# Patient Record
Sex: Female | Born: 1970
Health system: Southern US, Community
[De-identification: ages and names within clinical notes are randomized; demographics above are authoritative.]

## PROBLEM LIST (undated history)

## (undated) DIAGNOSIS — B009 Herpesviral infection, unspecified: Secondary | ICD-10-CM

## (undated) DIAGNOSIS — J189 Pneumonia, unspecified organism: Secondary | ICD-10-CM

## (undated) DIAGNOSIS — F419 Anxiety disorder, unspecified: Secondary | ICD-10-CM

## (undated) DIAGNOSIS — B20 Human immunodeficiency virus [HIV] disease: Secondary | ICD-10-CM

## (undated) DIAGNOSIS — F329 Major depressive disorder, single episode, unspecified: Secondary | ICD-10-CM

## (undated) DIAGNOSIS — F32A Depression, unspecified: Secondary | ICD-10-CM

## (undated) HISTORY — PX: DENTAL SURGERY: SHX609

---
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
## 2006-05-15 DIAGNOSIS — Z21 Asymptomatic human immunodeficiency virus [HIV] infection status: Secondary | ICD-10-CM

## 2006-05-15 DIAGNOSIS — B20 Human immunodeficiency virus [HIV] disease: Secondary | ICD-10-CM

## 2006-05-15 DIAGNOSIS — N823 Fistula of vagina to large intestine: Secondary | ICD-10-CM | POA: Insufficient documentation

## 2006-05-15 HISTORY — DX: Asymptomatic human immunodeficiency virus (hiv) infection status: Z21

## 2006-05-15 HISTORY — DX: Human immunodeficiency virus (HIV) disease: B20

## 2016-06-08 DIAGNOSIS — F3110 Bipolar disorder, current episode manic without psychotic features, unspecified: Secondary | ICD-10-CM | POA: Diagnosis not present

## 2017-07-12 ENCOUNTER — Emergency Department (HOSPITAL_COMMUNITY): Payer: Medicare Other

## 2017-07-12 ENCOUNTER — Encounter (HOSPITAL_COMMUNITY): Payer: Self-pay | Admitting: Emergency Medicine

## 2017-07-12 DIAGNOSIS — R197 Diarrhea, unspecified: Secondary | ICD-10-CM | POA: Diagnosis not present

## 2017-07-12 DIAGNOSIS — B372 Candidiasis of skin and nail: Secondary | ICD-10-CM | POA: Diagnosis present

## 2017-07-12 DIAGNOSIS — D509 Iron deficiency anemia, unspecified: Secondary | ICD-10-CM | POA: Diagnosis present

## 2017-07-12 DIAGNOSIS — R0902 Hypoxemia: Secondary | ICD-10-CM | POA: Diagnosis not present

## 2017-07-12 DIAGNOSIS — B59 Pneumocystosis: Secondary | ICD-10-CM | POA: Diagnosis not present

## 2017-07-12 DIAGNOSIS — F3162 Bipolar disorder, current episode mixed, moderate: Secondary | ICD-10-CM | POA: Diagnosis present

## 2017-07-12 DIAGNOSIS — F419 Anxiety disorder, unspecified: Secondary | ICD-10-CM | POA: Diagnosis not present

## 2017-07-12 DIAGNOSIS — Z91013 Allergy to seafood: Secondary | ICD-10-CM

## 2017-07-12 DIAGNOSIS — B20 Human immunodeficiency virus [HIV] disease: Secondary | ICD-10-CM | POA: Diagnosis not present

## 2017-07-12 DIAGNOSIS — Z87891 Personal history of nicotine dependence: Secondary | ICD-10-CM

## 2017-07-12 DIAGNOSIS — J9601 Acute respiratory failure with hypoxia: Secondary | ICD-10-CM | POA: Diagnosis present

## 2017-07-12 DIAGNOSIS — E860 Dehydration: Secondary | ICD-10-CM | POA: Diagnosis present

## 2017-07-12 DIAGNOSIS — R05 Cough: Secondary | ICD-10-CM | POA: Diagnosis not present

## 2017-07-12 DIAGNOSIS — J189 Pneumonia, unspecified organism: Secondary | ICD-10-CM | POA: Diagnosis not present

## 2017-07-12 DIAGNOSIS — Z23 Encounter for immunization: Secondary | ICD-10-CM

## 2017-07-12 DIAGNOSIS — J439 Emphysema, unspecified: Secondary | ICD-10-CM | POA: Diagnosis not present

## 2017-07-12 LAB — COMPREHENSIVE METABOLIC PANEL
ALT: 13 U/L — ABNORMAL LOW (ref 14–54)
ANION GAP: 11 (ref 5–15)
AST: 34 U/L (ref 15–41)
Albumin: 3 g/dL — ABNORMAL LOW (ref 3.5–5.0)
Alkaline Phosphatase: 66 U/L (ref 38–126)
BUN: 10 mg/dL (ref 6–20)
CHLORIDE: 101 mmol/L (ref 101–111)
CO2: 23 mmol/L (ref 22–32)
Calcium: 8.4 mg/dL — ABNORMAL LOW (ref 8.9–10.3)
Creatinine, Ser: 0.8 mg/dL (ref 0.44–1.00)
GFR calc Af Amer: >60 mL/min (ref >60)
Glucose, Bld: 93 mg/dL (ref 65–99)
POTASSIUM: 3.2 mmol/L — AB (ref 3.5–5.1)
Sodium: 135 mmol/L (ref 135–145)
TOTAL PROTEIN: 7.4 g/dL (ref 6.5–8.1)
Total Bilirubin: 0.7 mg/dL (ref 0.3–1.2)

## 2017-07-12 LAB — CBC
HEMATOCRIT: 31.6 % — AB (ref 36.0–46.0)
HEMOGLOBIN: 9.6 g/dL — AB (ref 12.0–15.0)
MCH: 22.1 pg — ABNORMAL LOW (ref 26.0–34.0)
MCHC: 30.4 g/dL (ref 30.0–36.0)
MCV: 72.6 fL — AB (ref 78.0–100.0)
Platelets: 245 10*3/uL (ref 150–400)
RBC: 4.35 MIL/uL (ref 3.87–5.11)
RDW: 18.8 % — AB (ref 11.5–15.5)
WBC: 6.2 10*3/uL (ref 4.0–10.5)

## 2017-07-12 LAB — LIPASE, BLOOD: LIPASE: 26 U/L (ref 11–51)

## 2017-07-12 MED ORDER — ACETAMINOPHEN 325 MG PO TABS
650.0000 mg | ORAL_TABLET | Freq: Once | ORAL | Status: AC | PRN
  Administered 2017-07-12: 650 mg via ORAL
  Filled 2017-07-12: qty 2

## 2017-07-12 NOTE — ED Triage Notes (Addendum)
Patient c/o productive cough x2 months and diarrhea x2 weeks. Denies abdominal pain and N/V/D. Hx HIV.

## 2017-07-13 ENCOUNTER — Other Ambulatory Visit: Payer: Self-pay

## 2017-07-13 ENCOUNTER — Emergency Department (HOSPITAL_COMMUNITY): Payer: Medicare Other

## 2017-07-13 ENCOUNTER — Inpatient Hospital Stay (HOSPITAL_COMMUNITY)
Admission: EM | Admit: 2017-07-13 | Discharge: 2017-07-19 | DRG: 974 | Disposition: A | Discharge status: Home / Self Care | Payer: Medicare Other | Attending: Internal Medicine | Admitting: Internal Medicine

## 2017-07-13 ENCOUNTER — Encounter (HOSPITAL_COMMUNITY): Payer: Self-pay

## 2017-07-13 DIAGNOSIS — J9601 Acute respiratory failure with hypoxia: Secondary | ICD-10-CM | POA: Diagnosis present

## 2017-07-13 DIAGNOSIS — F32A Depression, unspecified: Secondary | ICD-10-CM | POA: Diagnosis present

## 2017-07-13 DIAGNOSIS — F3112 Bipolar disorder, current episode manic without psychotic features, moderate: Secondary | ICD-10-CM

## 2017-07-13 DIAGNOSIS — B20 Human immunodeficiency virus [HIV] disease: Secondary | ICD-10-CM | POA: Diagnosis present

## 2017-07-13 DIAGNOSIS — Z23 Encounter for immunization: Secondary | ICD-10-CM | POA: Diagnosis not present

## 2017-07-13 DIAGNOSIS — R062 Wheezing: Secondary | ICD-10-CM | POA: Diagnosis not present

## 2017-07-13 DIAGNOSIS — E860 Dehydration: Secondary | ICD-10-CM | POA: Diagnosis not present

## 2017-07-13 DIAGNOSIS — Z87891 Personal history of nicotine dependence: Secondary | ICD-10-CM | POA: Diagnosis not present

## 2017-07-13 DIAGNOSIS — Z21 Asymptomatic human immunodeficiency virus [HIV] infection status: Secondary | ICD-10-CM | POA: Diagnosis present

## 2017-07-13 DIAGNOSIS — B59 Pneumocystosis: Secondary | ICD-10-CM | POA: Diagnosis not present

## 2017-07-13 DIAGNOSIS — J439 Emphysema, unspecified: Secondary | ICD-10-CM | POA: Diagnosis not present

## 2017-07-13 DIAGNOSIS — F329 Major depressive disorder, single episode, unspecified: Secondary | ICD-10-CM | POA: Diagnosis present

## 2017-07-13 DIAGNOSIS — R51 Headache: Secondary | ICD-10-CM | POA: Diagnosis not present

## 2017-07-13 DIAGNOSIS — F3162 Bipolar disorder, current episode mixed, moderate: Secondary | ICD-10-CM | POA: Diagnosis present

## 2017-07-13 DIAGNOSIS — R197 Diarrhea, unspecified: Secondary | ICD-10-CM | POA: Diagnosis present

## 2017-07-13 DIAGNOSIS — R0902 Hypoxemia: Secondary | ICD-10-CM

## 2017-07-13 DIAGNOSIS — Z91013 Allergy to seafood: Secondary | ICD-10-CM | POA: Diagnosis not present

## 2017-07-13 DIAGNOSIS — J189 Pneumonia, unspecified organism: Secondary | ICD-10-CM | POA: Diagnosis present

## 2017-07-13 DIAGNOSIS — D509 Iron deficiency anemia, unspecified: Secondary | ICD-10-CM | POA: Diagnosis present

## 2017-07-13 DIAGNOSIS — F419 Anxiety disorder, unspecified: Secondary | ICD-10-CM | POA: Diagnosis present

## 2017-07-13 DIAGNOSIS — R05 Cough: Secondary | ICD-10-CM | POA: Diagnosis not present

## 2017-07-13 DIAGNOSIS — K5900 Constipation, unspecified: Secondary | ICD-10-CM | POA: Diagnosis not present

## 2017-07-13 DIAGNOSIS — R42 Dizziness and giddiness: Secondary | ICD-10-CM | POA: Diagnosis not present

## 2017-07-13 DIAGNOSIS — B379 Candidiasis, unspecified: Secondary | ICD-10-CM | POA: Diagnosis not present

## 2017-07-13 DIAGNOSIS — R509 Fever, unspecified: Secondary | ICD-10-CM | POA: Diagnosis not present

## 2017-07-13 DIAGNOSIS — R634 Abnormal weight loss: Secondary | ICD-10-CM | POA: Diagnosis not present

## 2017-07-13 DIAGNOSIS — B372 Candidiasis of skin and nail: Secondary | ICD-10-CM | POA: Diagnosis present

## 2017-07-13 DIAGNOSIS — R0602 Shortness of breath: Secondary | ICD-10-CM | POA: Diagnosis not present

## 2017-07-13 DIAGNOSIS — R0600 Dyspnea, unspecified: Secondary | ICD-10-CM | POA: Diagnosis not present

## 2017-07-13 HISTORY — DX: Human immunodeficiency virus (HIV) disease: B20

## 2017-07-13 HISTORY — DX: Depression, unspecified: F32.A

## 2017-07-13 HISTORY — DX: Major depressive disorder, single episode, unspecified: F32.9

## 2017-07-13 HISTORY — DX: Anxiety disorder, unspecified: F41.9

## 2017-07-13 LAB — INFLUENZA PANEL BY PCR (TYPE A & B)
Influenza A By PCR: NEGATIVE
Influenza B By PCR: NEGATIVE

## 2017-07-13 LAB — URINALYSIS, ROUTINE W REFLEX MICROSCOPIC
Bilirubin Urine: NEGATIVE
GLUCOSE, UA: NEGATIVE mg/dL
KETONES UR: 20 mg/dL — AB
NITRITE: NEGATIVE
PROTEIN: NEGATIVE mg/dL
Specific Gravity, Urine: 1.035 — ABNORMAL HIGH (ref 1.005–1.030)
pH: 6 (ref 5.0–8.0)

## 2017-07-13 LAB — T-HELPER CELLS (CD4) COUNT (NOT AT ARMC)
CD4 % Helper T Cell: 8 % — ABNORMAL LOW (ref 33–55)
CD4 T Cell Abs: 50 /uL — ABNORMAL LOW (ref 400–2700)

## 2017-07-13 LAB — POC URINE PREG, ED: Preg Test, Ur: NEGATIVE

## 2017-07-13 MED ORDER — SODIUM CHLORIDE 0.9% FLUSH
3.0000 mL | Freq: Two times a day (BID) | INTRAVENOUS | Status: DC
  Administered 2017-07-13 – 2017-07-18 (×8): 3 mL via INTRAVENOUS

## 2017-07-13 MED ORDER — SODIUM CHLORIDE 0.9 % IV SOLN
1.0000 g | Freq: Once | INTRAVENOUS | Status: AC
  Administered 2017-07-13: 1 g via INTRAVENOUS
  Filled 2017-07-13: qty 10

## 2017-07-13 MED ORDER — SODIUM CHLORIDE 0.9 % IV SOLN
250.0000 mL | INTRAVENOUS | Status: DC | PRN
  Administered 2017-07-14: 250 mL via INTRAVENOUS

## 2017-07-13 MED ORDER — SODIUM CHLORIDE 0.9 % IJ SOLN
INTRAMUSCULAR | Status: AC
  Filled 2017-07-13: qty 50

## 2017-07-13 MED ORDER — SODIUM CHLORIDE 0.9 % IV SOLN
500.0000 mg | INTRAVENOUS | Status: DC
  Administered 2017-07-14: 500 mg via INTRAVENOUS
  Filled 2017-07-13 (×2): qty 500

## 2017-07-13 MED ORDER — IOPAMIDOL (ISOVUE-300) INJECTION 61%
75.0000 mL | Freq: Once | INTRAVENOUS | Status: AC | PRN
  Administered 2017-07-13: 75 mL via INTRAVENOUS

## 2017-07-13 MED ORDER — SODIUM CHLORIDE 0.9% FLUSH: 3.0000 mL | INTRAVENOUS | Status: DC | PRN

## 2017-07-13 MED ORDER — SODIUM CHLORIDE 0.9 % IV BOLUS (SEPSIS)
500.0000 mL | Freq: Once | INTRAVENOUS | Status: AC
  Administered 2017-07-13: 500 mL via INTRAVENOUS

## 2017-07-13 MED ORDER — SODIUM CHLORIDE 0.9 % IV SOLN
500.0000 mg | Freq: Once | INTRAVENOUS | Status: AC
  Administered 2017-07-13: 500 mg via INTRAVENOUS
  Filled 2017-07-13: qty 500

## 2017-07-13 MED ORDER — SODIUM CHLORIDE 0.9 % IV SOLN
1.0000 g | INTRAVENOUS | Status: DC
  Administered 2017-07-14: 1 g via INTRAVENOUS
  Filled 2017-07-13: qty 1

## 2017-07-13 MED ORDER — SODIUM CHLORIDE 0.9 % IV BOLUS (SEPSIS)
1000.0000 mL | Freq: Once | INTRAVENOUS | Status: AC
  Administered 2017-07-13: 1000 mL via INTRAVENOUS

## 2017-07-13 MED ORDER — IOPAMIDOL (ISOVUE-300) INJECTION 61%
INTRAVENOUS | Status: AC
  Filled 2017-07-13: qty 75

## 2017-07-13 NOTE — ED Notes (Addendum)
Droplet precautions initiated and maintained 

## 2017-07-13 NOTE — ED Provider Notes (Signed)
[facility]-EMERGENCY DEPT Provider Note   CSN: [id number] Arrival date & time: 07/12/17  1959  Time seen 05:00 AM   History   Chief Complaint Chief Complaint  Patient presents with  . Cough  . Diarrhea    HPI Grace Randall is a 47 y.o. female.  HPI patient states she has had a cough for 3 or 4 weeks.  She feels short of breath.  She states her throat is dry.  She states however at times her cough is mild.  She started sneezing 3 or 4 weeks ago.  She has had diarrhea for a few days.  She states she gets diarrhea "every time I eat or drink".  She states she had 3 episodes the night before and 2 during the day today.  She states that slimy and yellow.  She denies nausea, vomiting, abdominal pain.  She states she feels dizzy and lightheaded and weak.  She states all she does is sleep.  She is unsure of when she started having fevers but she has had chills for a week.  She denies wheezing.  She reports being around other people who are sick.  PCP in [facility]  Past Medical History:  Diagnosis Date  . Anxiety   . Depression   . HIV (human immunodeficiency virus infection) (HCC)     There are no active problems to display for this patient.   History reviewed. No pertinent surgical history.  OB History    No data available       Home Medications    Not taking Seroquel and Risperdal  Prior to Admission medications   Medication Sig Start Date End Date Taking? Authorizing Provider  dextromethorphan-guaiFENesin (MUCINEX DM) 30-600 MG 12hr tablet Take 1 tablet by mouth at bedtime as needed for cough.   Yes [provider]  Pseudoeph-Doxylamine-DM-APAP (NYQUIL PO) Take 1 capsule by mouth at bedtime as needed (cold).   Yes [provider]  Pseudoephedrine-APAP-DM (DAYQUIL PO) Take 1 capsule by mouth daily as needed (cold).   Yes [provider]    Family History No family history on file.  Social History Social History   Tobacco Use    . Smoking status: Not on file  Substance Use Topics  . Alcohol use: Not on file  . Drug use: Not on file  on disability for depression and anxiety Smokes 1 ppd Drinks occasionally, last 6 months ago   Allergies   Other   Review of Systems Review of Systems  All other systems reviewed and are negative.    Physical Exam Updated Vital Signs BP 116/64 (BP Location: Left Arm)   Pulse 85   Temp 98.8 F (37.1 C) (Oral)   Resp 16   LMP 07/12/2017   SpO2 99%   Physical Exam  Constitutional: She is oriented to person, place, and time.  Non-toxic appearance. She does not appear ill. No distress.  Thin female  HENT:  Head: Normocephalic and atraumatic.  Right Ear: External ear normal.  Left Ear: External ear normal.  Nose: Nose normal. No mucosal edema or rhinorrhea.  Mouth/Throat: Oropharynx is clear and moist and mucous membranes are normal. No dental abscesses or uvula swelling.  Eyes: Conjunctivae and EOM are normal. Pupils are equal, round, and reactive to light.  Neck: Normal range of motion and full passive range of motion without pain. Neck supple.  Cardiovascular: Regular rhythm and normal heart sounds. Tachycardia present. Exam reveals no gallop and no friction rub.  No  murmur heard. Pulmonary/Chest: Effort normal. Tachypnea noted. No respiratory distress. She has decreased breath sounds. She has no wheezes. She has no rhonchi. She has no rales. She exhibits no tenderness and no crepitus.  Abdominal: Soft. Normal appearance and bowel sounds are normal. She exhibits no distension. There is no tenderness. There is no rebound and no guarding.  Musculoskeletal: Normal range of motion. She exhibits no edema or tenderness.  Moves all extremities well.   Neurological: She is alert and oriented to person, place, and time. She has normal strength. No cranial nerve deficit.  Skin: Skin is warm, dry and intact. No rash noted. No erythema. No pallor.  Psychiatric: She has a normal  mood and affect. Her speech is normal and behavior is normal. Her mood appears not anxious.  Nursing note and vitals reviewed.    ED Treatments / Results  Labs (all labs ordered are listed, but only abnormal results are displayed) Results for orders placed or performed during the hospital encounter of 07/13/17  Lipase, blood  Result Value Ref Range   Lipase 26 11 - 51 U/L  Comprehensive metabolic panel  Result Value Ref Range   Sodium 135 135 - 145 mmol/L   Potassium 3.2 (L) 3.5 - 5.1 mmol/L   Chloride 101 101 - 111 mmol/L   CO2 23 22 - 32 mmol/L   Glucose, Bld 93 65 - 99 mg/dL   BUN 10 6 - 20 mg/dL   Creatinine, Ser 1.61 0.44 - 1.00 mg/dL   Calcium 8.4 (L) 8.9 - 10.3 mg/dL   Total Protein 7.4 6.5 - 8.1 g/dL   Albumin 3.0 (L) 3.5 - 5.0 g/dL   AST 34 15 - 41 U/L   ALT 13 (L) 14 - 54 U/L   Alkaline Phosphatase 66 38 - 126 U/L   Total Bilirubin 0.7 0.3 - 1.2 mg/dL   GFR calc non Af Amer >60 >60 mL/min   GFR calc Af Amer >60 >60 mL/min   Anion gap 11 5 - 15  CBC  Result Value Ref Range   WBC 6.2 4.0 - 10.5 K/uL   RBC 4.35 3.87 - 5.11 MIL/uL   Hemoglobin 9.6 (L) 12.0 - 15.0 g/dL   HCT 09.6 (L) 04.5 - 40.9 %   MCV 72.6 (L) 78.0 - 100.0 fL   MCH 22.1 (L) 26.0 - 34.0 pg   MCHC 30.4 30.0 - 36.0 g/dL   RDW 81.1 (H) 91.4 - 78.2 %   Platelets 245 150 - 400 K/uL   Laboratory interpretation all normal except anemia with no old labs to compare    EKG  EKG Interpretation None       Radiology Dg Chest 2 View  Result Date: 07/12/2017 CLINICAL DATA:  Cough EXAM: CHEST  2 VIEW COMPARISON:  None. FINDINGS: Suggestion of mild perihilar ground-glass opacity. No consolidation or pleural effusion. Normal heart size. No pneumothorax. IMPRESSION: Possible subtle perihilar ground-glass infiltrates, CT would be more confirmatory. Otherwise negative two-view chest. Electronically Signed   By: Jasmine Pang M.D.   On: 07/12/2017 20:55   Ct Chest W Contrast  Result Date:  07/13/2017 CLINICAL DATA:  47 year old female with cough.  History of HIV. EXAM: CT CHEST WITH CONTRAST TECHNIQUE: Multidetector CT imaging of the chest was performed during intravenous contrast administration. CONTRAST:  75mL ISOVUE-300 IOPAMIDOL (ISOVUE-300) INJECTION 61% COMPARISON:  Chest radiograph dated 07/12/2017 FINDINGS: Cardiovascular: There is no cardiomegaly or pericardial effusion. The thoracic aorta is unremarkable. The origins of the great vessels of  the aortic arch are patent. Evaluation of the pulmonary arteries is limited due to suboptimal enhancement and timing of the contrast. This study is essentially nondiagnostic for evaluation of pulmonary embolism. Mediastinum/Nodes: No hilar adenopathy. Anterior paratracheal lymph node measures 13 mm in short axis. The esophagus is grossly unremarkable. Heterogeneous appearance of the left thyroid gland and probable subcentimeter left thyroid nodule. Ultrasound may provide better evaluation. No mediastinal fluid collection. Lungs/Pleura: There is subpleural emphysema with small cystic changes of the upper lobes. Diffuse bilateral perihilar and upper lobe predominant ground-glass density throughout the lungs concerning for pneumonia, possibly multifocal bacterial infection or related to an opportunistic agent such as pneumocystis pneumonia (PCP) or CMV pneumonia. Non infectious etiology such as lymphocytic interstitial pneumonitis is less likely given rebound upper lobe involvement. Clinical correlation and pulmonary consult is advised. There is no pleural effusion or pneumothorax. The central airways are patent. Upper Abdomen: No acute abnormality. Musculoskeletal: No chest wall abnormality. No acute or significant osseous findings. IMPRESSION: Diffuse bilateral and upper lobe predominant ground-glass and hazy airspace density concerning for pneumonia. Clinical correlation and pulmonary consult is advised. Electronically Signed   By: Elgie CollardArash  Radparvar M.D.    On: 07/13/2017 07:01    Procedures Procedures (including critical care time)  Medications Ordered in ED Medications  azithromycin (ZITHROMAX) 500 mg in sodium chloride 0.9 % 250 mL IVPB (500 mg Intravenous New Bag/Given 07/13/17 0656)  sodium chloride 0.9 % injection (not administered)  acetaminophen (TYLENOL) tablet 650 mg (650 mg Oral Given 07/12/17 2031)  sodium chloride 0.9 % bolus 1,000 mL (0 mLs Intravenous Stopped 07/13/17 0700)  sodium chloride 0.9 % bolus 500 mL (500 mLs Intravenous New Bag/Given 07/13/17 0547)  cefTRIAXone (ROCEPHIN) 1 g in sodium chloride 0.9 % 100 mL IVPB (0 g Intravenous Stopped 07/13/17 0658)  iopamidol (ISOVUE-300) 61 % injection 75 mL (75 mLs Intravenous Contrast Given 07/13/17 [id number])     Initial Impression / Assessment and Plan / ED Course  I have reviewed the triage vital signs and the nursing notes.  Pertinent labs & imaging results that were available during my care of the patient were reviewed by me and considered in my medical decision making (see chart for details).     During my exam patient's pulse ox was 84-86%.  She was placed on nasal cannula oxygen.  She was given IV fluids for dehydration.  We discussed her lab tests and her CXR and she is agreeable to have a chest CT to evaluate her abnormal chest x-ray. She states she has HIV on no meds and her viral loads had been barely Detectable before and her CD4 counts were "okay". She was started on IV antibiotics.  She was started on community-acquired pneumonia antibiotics.  After reviewing patient's CT scan consult was made to the hospitalist for admission.  Patient is hypoxic with her pneumonia, she has a history of HIV although she has a normal total white blood cell count she does not appear to be immune compromised.  07:21 AM Dr Onalee Huaavid, will admit. Flu test ordered.   Final Clinical Impressions(s) / ED Diagnoses   Final diagnoses:  Community acquired pneumonia, unspecified laterality  Hypoxia   Diarrhea, unspecified type  Dehydration    Plan admission  Devoria AlbeIva Raymonde Hamblin, MD, [facility]P    Hilarie Sinha, MD 07/13/17 [phone]

## 2017-07-13 NOTE — H&P (Signed)
History and Physical    Phylicia Mcgaugh ZOX:[id number] DOB: [birth year] DOA: 07/13/2017  PCP: System, Pcp Not In  Patient coming from: Home  Chief Complaint: Cough  HPI: Grace Randall is a 47 y.o. female with medical history significant of HIV off medications for over 2 years, anxiety, depression comes in with over a month of coughing that is progressively worsened.  She reports a lot of diarrhea also.  She did not know she was running fever her temperature here is almost 102.  She denies any flulike symptoms.  She denies any abdominal pain.  She has not had any nausea or vomiting.  Patient reports she is been off of her meds because she did not like taking them.  She was previously getting her care in [place] she is currently living in [place].  Patient is very upset that I have discussing her HIV status with her.  She is refusing to see an ID doctor and does not want to establish care with anyone for her HIV.  She says "I just do not want to do with that right now".  Patient is referred for admission for bilateral pneumonia.  She is hypoxic at 85% on room air.  Review of Systems: As per HPI otherwise 10 point review of systems negative.   Past Medical History:  Diagnosis Date  . Anxiety   . Depression   . HIV (human immunodeficiency virus infection) (HCC)     History reviewed. No pertinent surgical history.  None   has no tobacco, alcohol, and drug history on file.  Negative x3  Allergies  Allergen Reactions  . Other Anaphylaxis and Other (See Comments)    No Seafood     No family history on file.  No premature coronary artery disease  Prior to Admission medications   Medication Sig Start Date End Date Taking? Authorizing Provider  dextromethorphan-guaiFENesin (MUCINEX DM) 30-600 MG 12hr tablet Take 1 tablet by mouth at bedtime as needed for cough.   Yes [provider]  Pseudoeph-Doxylamine-DM-APAP (NYQUIL PO) Take 1 capsule by mouth at bedtime as needed (cold).   Yes  [provider]  Pseudoephedrine-APAP-DM (DAYQUIL PO) Take 1 capsule by mouth daily as needed (cold).   Yes [provider]    Physical Exam: Vitals:   07/12/17 2022 07/13/17 0014 07/13/17 0429 07/13/17 0659  BP: 130/73 98/65 (!) 134/58 116/64  Pulse: (!) 106 89 99 85  Resp: 20 (!) 22 17 16   Temp: (!) 101.5 F (38.6 C) 98.5 F (36.9 C) 98.8 F (37.1 C)   TempSrc: Oral Oral Oral   SpO2: 90% 90% 90% 99%      Constitutional: NAD, calm, comfortable Vitals:   07/12/17 2022 07/13/17 0014 07/13/17 0429 07/13/17 0659  BP: 130/73 98/65 (!) 134/58 116/64  Pulse: (!) 106 89 99 85  Resp: 20 (!) 22 17 16   Temp: (!) 101.5 F (38.6 C) 98.5 F (36.9 C) 98.8 F (37.1 C)   TempSrc: Oral Oral Oral   SpO2: 90% 90% 90% 99%   Eyes: PERRL, lids and conjunctivae normal ENMT: Mucous membranes are moist. Posterior pharynx clear of any exudate or lesions.Normal dentition.  Neck: normal, supple, no masses, no thyromegaly Respiratory: clear to auscultation bilaterally, no wheezing, no crackles. Normal respiratory effort. No accessory muscle use.  Cardiovascular: Regular rate and rhythm, no murmurs / rubs / gallops. No extremity edema. 2+ pedal pulses. No carotid bruits.  Abdomen: no tenderness, no masses palpated. No hepatosplenomegaly. Bowel sounds positive.  Musculoskeletal:  no clubbing / cyanosis. No joint deformity upper and lower extremities. Good ROM, no contractures. Normal muscle tone.  Skin: no rashes, lesions, ulcers. No induration Neurologic: CN 2-12 grossly intact. Sensation intact, DTR normal. Strength 5/5 in all 4.  Psychiatric: Normal judgment and insight. Alert and oriented x 3. Normal mood.    Labs on Admission: I have personally reviewed following labs and imaging studies  CBC: Recent Labs  Lab 07/12/17 2045  WBC 6.2  HGB 9.6*  HCT 31.6*  MCV 72.6*  PLT 245   Basic Metabolic Panel: Recent Labs  Lab 07/12/17 2045  NA 135  K 3.2*  CL 101  CO2 23    GLUCOSE 93  BUN 10  CREATININE 0.80  CALCIUM 8.4*   GFR: CrCl cannot be calculated (Unknown ideal weight.). Liver Function Tests: Recent Labs  Lab 07/12/17 2045  AST 34  ALT 13*  ALKPHOS 66  BILITOT 0.7  PROT 7.4  ALBUMIN 3.0*   Recent Labs  Lab 07/12/17 2045  LIPASE 26   No results for input(s): AMMONIA in the last 168 hours. Coagulation Profile: No results for input(s): INR, PROTIME in the last 168 hours. Cardiac Enzymes: No results for input(s): CKTOTAL, CKMB, CKMBINDEX, TROPONINI in the last 168 hours. BNP (last 3 results) No results for input(s): PROBNP in the last 8760 hours. HbA1C: No results for input(s): HGBA1C in the last 72 hours. CBG: No results for input(s): GLUCAP in the last 168 hours. Lipid Profile: No results for input(s): CHOL, HDL, LDLCALC, TRIG, CHOLHDL, LDLDIRECT in the last 72 hours. Thyroid Function Tests: No results for input(s): TSH, T4TOTAL, FREET4, T3FREE, THYROIDAB in the last 72 hours. Anemia Panel: No results for input(s): VITAMINB12, FOLATE, FERRITIN, TIBC, IRON, RETICCTPCT in the last 72 hours. Urine analysis: No results found for: COLORURINE, APPEARANCEUR, LABSPEC, PHURINE, GLUCOSEU, HGBUR, BILIRUBINUR, KETONESUR, PROTEINUR, UROBILINOGEN, NITRITE, LEUKOCYTESUR Sepsis Labs: !!!!!!!!!!!!!!!!!!!!!!!!!!!!!!!!!!!!!!!!!!!! @LABRCNTIP (procalcitonin:4,lacticidven:4) )No results found for this or any previous visit (from the past 240 hour(s)).   Radiological Exams on Admission: Dg Chest 2 View  Result Date: 07/12/2017 CLINICAL DATA:  Cough EXAM: CHEST  2 VIEW COMPARISON:  None. FINDINGS: Suggestion of mild perihilar ground-glass opacity. No consolidation or pleural effusion. Normal heart size. No pneumothorax. IMPRESSION: Possible subtle perihilar ground-glass infiltrates, CT would be more confirmatory. Otherwise negative two-view chest. Electronically Signed   By: Jasmine PangKim  Fujinaga M.D.   On: 07/12/2017 20:55   Ct Chest W Contrast  Result  Date: 07/13/2017 CLINICAL DATA:  47 year old female with cough.  History of HIV. EXAM: CT CHEST WITH CONTRAST TECHNIQUE: Multidetector CT imaging of the chest was performed during intravenous contrast administration. CONTRAST:  75mL ISOVUE-300 IOPAMIDOL (ISOVUE-300) INJECTION 61% COMPARISON:  Chest radiograph dated 07/12/2017 FINDINGS: Cardiovascular: There is no cardiomegaly or pericardial effusion. The thoracic aorta is unremarkable. The origins of the great vessels of the aortic arch are patent. Evaluation of the pulmonary arteries is limited due to suboptimal enhancement and timing of the contrast. This study is essentially nondiagnostic for evaluation of pulmonary embolism. Mediastinum/Nodes: No hilar adenopathy. Anterior paratracheal lymph node measures 13 mm in short axis. The esophagus is grossly unremarkable. Heterogeneous appearance of the left thyroid gland and probable subcentimeter left thyroid nodule. Ultrasound may provide better evaluation. No mediastinal fluid collection. Lungs/Pleura: There is subpleural emphysema with small cystic changes of the upper lobes. Diffuse bilateral perihilar and upper lobe predominant ground-glass density throughout the lungs concerning for pneumonia, possibly multifocal bacterial infection or related to an opportunistic agent such as pneumocystis pneumonia (  PCP) or CMV pneumonia. Non infectious etiology such as lymphocytic interstitial pneumonitis is less likely given rebound upper lobe involvement. Clinical correlation and pulmonary consult is advised. There is no pleural effusion or pneumothorax. The central airways are patent. Upper Abdomen: No acute abnormality. Musculoskeletal: No chest wall abnormality. No acute or significant osseous findings. IMPRESSION: Diffuse bilateral and upper lobe predominant ground-glass and hazy airspace density concerning for pneumonia. Clinical correlation and pulmonary consult is advised. Electronically Signed   By: Elgie Collard  M.D.   On: 07/13/2017 07:01    Chest x-ray reviewed bilateral groundglass infiltrates Case discussed with Dr. Lynelle Doctor in the ED Old chart reviewed Case discussed with Dr. Ninetta Lights with ID  Assessment/Plan 47 year old female HIV status comes in with fever and cough found to have bilateral pneumonia and acute hypoxic respiratory failure Principal Problem:   PNA (pneumonia)-placed on community-acquired coverage with Rocephin and azithromycin.  Obtain sputum culture.  Check influenza panel.  Blood cultures obtained.  Supplemental oxygen as needed.  Patient currently in no respiratory distress.  Active Problems:   Acute respiratory failure with hypoxia (HCC)-due to bilateral pneumonia as above    HIV (human immunodeficiency virus infection) (HCC)-check viral load and CD4 count    Diarrhea-check stool cultures, abdominal exam benign    Anxiety-noted    Depression-noted   I discussed with Dr. Ninetta Lights recommends community acquired pneumonia coverage and obtaining culture data.  Have not requested a formal consult at this time because patient is refusing to see ID at this time.  I have encouraged patient to think about reconsidering this decision and she is going to think about this and may change her mind in the next day or so.  She thinks her HIV is still undetectable  Despite being off meds for over 2 years.  Discussion was held with emergency room nurse as witness.  Patient is very worried that her family and friends are going to find out about her HIV status.  I have reassured her that we will provide and protect her privacy while she is here.   DVT prophylaxis: SCDs Code Status: Full Family Communication: None isposition Plan: Per day team Consults called: None Admission status: Admission   [name] A MD [facility]  If 7PM-7AM, please contact night-coverage [email or web address] Password [email or web address]  07/13/2017, 8:09 AM

## 2017-07-13 NOTE — ED Notes (Signed)
ED TO INPATIENT HANDOFF REPORT  Name/Age/Gender Grace Randall 47 y.o. female  Code Status    Code Status Orders  (From admission, onward)        Start     Ordered   07/13/17 0812  Full code  Continuous     07/13/17 0812    Code Status History    Date Active Date Inactive Code Status Order ID Comments User Context   This patient has a current code status but no historical code status.      Home/SNF/Other Home  Chief Complaint Cough;Dirrhea  Level of Care/Admitting Diagnosis ED Disposition    ED Disposition Condition Comment   Admit  Hospital Area: [facility] [[id number]]  Level of Care: Med-Surg [16]  Diagnosis: PNA (pneumonia) [696295]  Admitting Physician: Phillips Grout [[id number]]  Attending Physician: Derrill Kay A [[id number]]  Estimated length of stay: 3 - 4 days  Certification:: I certify this patient will need inpatient services for at least 2 midnights  PT Class (Do Not Modify): Inpatient [101]  PT Acc Code (Do Not Modify): Private [1]       Medical History Past Medical History:  Diagnosis Date  . Anxiety   . Depression   . HIV (human immunodeficiency virus infection) ([place])     Allergies Allergies  Allergen Reactions  . Other Anaphylaxis and Other (See Comments)    No Seafood     IV Location/Drains/Wounds Patient Lines/Drains/Airways Status   Active Line/Drains/Airways    None          Labs/Imaging Results for orders placed or performed during the hospital encounter of 07/13/17 (from the past 48 hour(s))  Lipase, blood     Status: None   Collection Time: 07/12/17  8:45 PM  Result Value Ref Range   Lipase 26 11 - 51 U/L    Comment: Performed at [facility], [place] [address] [place], [place] [address]  Comprehensive metabolic panel     Status: Abnormal   Collection Time: 07/12/17  8:45 PM  Result Value Ref Range   Sodium 135 135 - 145 mmol/L   Potassium 3.2 (L) 3.5 - 5.1 mmol/L   Chloride 101 101 - 111  mmol/L   CO2 23 22 - 32 mmol/L   Glucose, Bld 93 65 - 99 mg/dL   BUN 10 6 - 20 mg/dL   Creatinine, Ser 0.80 0.44 - 1.00 mg/dL   Calcium 8.4 (L) 8.9 - 10.3 mg/dL   Total Protein 7.4 6.5 - 8.1 g/dL   Albumin 3.0 (L) 3.5 - 5.0 g/dL   AST 34 15 - 41 U/L   ALT 13 (L) 14 - 54 U/L   Alkaline Phosphatase 66 38 - 126 U/L   Total Bilirubin 0.7 0.3 - 1.2 mg/dL   GFR calc non Af Amer >60 >60 mL/min   GFR calc Af Amer >60 >60 mL/min    Comment: (NOTE) The eGFR has been calculated using the CKD EPI equation. This calculation has not been validated in all clinical situations. eGFR's persistently <60 mL/min signify possible Chronic Kidney Disease.    Anion gap 11 5 - 15    Comment: Performed at [facility], [place] [address] [place], [place] [address]  CBC     Status: Abnormal   Collection Time: 07/12/17  8:45 PM  Result Value Ref Range   WBC 6.2 4.0 - 10.5 K/uL   RBC 4.35 3.87 - 5.11 MIL/uL   Hemoglobin 9.6 (L) 12.0 -  15.0 g/dL   HCT 31.6 (L) 36.0 - 46.0 %   MCV 72.6 (L) 78.0 - 100.0 fL   MCH 22.1 (L) 26.0 - 34.0 pg   MCHC 30.4 30.0 - 36.0 g/dL   RDW 18.8 (H) 11.5 - 15.5 %   Platelets 245 150 - 400 K/uL    Comment: Performed at [facility], [place] [address], [place], [place] [address]  POC Urine Pregnancy, ED (do NOT order at [facility])     Status: None   Collection Time: 07/13/17  7:37 AM  Result Value Ref Range   Preg Test, Ur NEGATIVE NEGATIVE    Comment:        THE SENSITIVITY OF THIS METHODOLOGY IS >24 mIU/mL    Dg Chest 2 View  Result Date: 07/12/2017 CLINICAL DATA:  Cough EXAM: CHEST  2 VIEW COMPARISON:  None. FINDINGS: Suggestion of mild perihilar ground-glass opacity. No consolidation or pleural effusion. Normal heart size. No pneumothorax. IMPRESSION: Possible subtle perihilar ground-glass infiltrates, CT would be more confirmatory. Otherwise negative two-view chest. Electronically Signed   By: Donavan Foil M.D.   On: 07/12/2017 20:55   Ct  Chest W Contrast  Result Date: 07/13/2017 CLINICAL DATA:  47 year old female with cough.  History of HIV. EXAM: CT CHEST WITH CONTRAST TECHNIQUE: Multidetector CT imaging of the chest was performed during intravenous contrast administration. CONTRAST:  74m ISOVUE-300 IOPAMIDOL (ISOVUE-300) INJECTION 61% COMPARISON:  Chest radiograph dated 07/12/2017 FINDINGS: Cardiovascular: There is no cardiomegaly or pericardial effusion. The thoracic aorta is unremarkable. The origins of the great vessels of the aortic arch are patent. Evaluation of the pulmonary arteries is limited due to suboptimal enhancement and timing of the contrast. This study is essentially nondiagnostic for evaluation of pulmonary embolism. Mediastinum/Nodes: No hilar adenopathy. Anterior paratracheal lymph node measures 13 mm in short axis. The esophagus is grossly unremarkable. Heterogeneous appearance of the left thyroid gland and probable subcentimeter left thyroid nodule. Ultrasound may provide better evaluation. No mediastinal fluid collection. Lungs/Pleura: There is subpleural emphysema with small cystic changes of the upper lobes. Diffuse bilateral perihilar and upper lobe predominant ground-glass density throughout the lungs concerning for pneumonia, possibly multifocal bacterial infection or related to an opportunistic agent such as pneumocystis pneumonia (PCP) or CMV pneumonia. Non infectious etiology such as lymphocytic interstitial pneumonitis is less likely given rebound upper lobe involvement. Clinical correlation and pulmonary consult is advised. There is no pleural effusion or pneumothorax. The central airways are patent. Upper Abdomen: No acute abnormality. Musculoskeletal: No chest wall abnormality. No acute or significant osseous findings. IMPRESSION: Diffuse bilateral and upper lobe predominant ground-glass and hazy airspace density concerning for pneumonia. Clinical correlation and pulmonary consult is advised. Electronically  Signed   By: AAnner CreteM.D.   On: 07/13/2017 07:01    Pending Labs Unresulted Labs (From admission, onward)   Start     Ordered   07/14/17 05597 Basic metabolic panel  Tomorrow morning,   R     07/13/17 0812   07/14/17 0500  CBC WITH DIFFERENTIAL  Tomorrow morning,   R     07/13/17 0812   07/13/17 0812  Culture, blood (routine x 2) Call MD if unable to obtain prior to antibiotics being given  BLOOD CULTURE X 2,   R    Comments:  If blood cultures drawn in Emergency Department - Do not draw and cancel order    07/13/17 0812   07/13/17 0812  Gram stain  Once,   R  07/13/17 0812   07/13/17 0812  Strep pneumoniae urinary antigen  Once,   R     07/13/17 0812   07/13/17 0812  Legionella Pneumophila Serogp 1 Ur Ag  Once,   R     07/13/17 0812   07/13/17 0758  Stool culture (children & immunocomp patients)  Once,   R     07/13/17 0757   07/13/17 0757  Culture, expectorated sputum-assessment  Once,   R     07/13/17 0757   07/13/17 0755  HIV-1 RNA, PCR (Graph) Rfx/Geno EDI  Once,   R     07/13/17 0754   07/13/17 0751  T-helper cells (CD4) count (not at [facility])  Once,   R     07/13/17 0754   07/13/17 0722  Influenza panel by PCR (type A & B)  (Influenza PCR Panel)  Once,   R     07/13/17 0722   07/12/17 2024  Urinalysis, Routine w reflex microscopic  STAT,   STAT     07/12/17 2023      Vitals/Pain Today's Vitals   07/12/17 2022 07/13/17 0014 07/13/17 0429 07/13/17 0659  BP: 130/73 98/65 (!) 134/58 116/64  Pulse: (!) 106 89 99 85  Resp: 20 (!) _0 Temp: (!) 101.5 F (38.6 C) 98.5 F (36.9 C) 98.8 F (37.1 C)   TempSrc: Oral Oral Oral   SpO2: 90% 90% 90% 99%    Isolation Precautions Droplet precaution  Medications Medications  sodium chloride 0.9 % injection (not administered)  sodium chloride flush (NS) 0.9 % injection 3 mL (not administered)  sodium chloride flush (NS) 0.9 % injection 3 mL (not administered)  0.9 %  sodium chloride infusion (not  administered)  cefTRIAXone (ROCEPHIN) 1 g in sodium chloride 0.9 % 100 mL IVPB (not administered)  azithromycin (ZITHROMAX) 500 mg in sodium chloride 0.9 % 250 mL IVPB (not administered)  acetaminophen (TYLENOL) tablet 650 mg (650 mg Oral Given 07/12/17 2031)  sodium chloride 0.9 % bolus 1,000 mL (0 mLs Intravenous Stopped 07/13/17 0700)  sodium chloride 0.9 % bolus 500 mL (0 mLs Intravenous Stopped 07/13/17 0752)  cefTRIAXone (ROCEPHIN) 1 g in sodium chloride 0.9 % 100 mL IVPB (0 g Intravenous Stopped 07/13/17 0658)  azithromycin (ZITHROMAX) 500 mg in sodium chloride 0.9 % 250 mL IVPB (0 mg Intravenous Stopped 07/13/17 0811)  iopamidol (ISOVUE-300) 61 % injection 75 mL (75 mLs Intravenous Contrast Given 07/13/17 0613)    Mobility walks

## 2017-07-13 NOTE — ED Notes (Signed)
Pt stated she is unable to give a urine sample at this time 

## 2017-07-13 NOTE — ED Notes (Signed)
Dr. David (hospitalist) at the bedside.  

## 2017-07-14 ENCOUNTER — Encounter (HOSPITAL_COMMUNITY): Payer: Self-pay | Admitting: Infectious Diseases

## 2017-07-14 DIAGNOSIS — R05 Cough: Secondary | ICD-10-CM

## 2017-07-14 DIAGNOSIS — R0602 Shortness of breath: Secondary | ICD-10-CM

## 2017-07-14 DIAGNOSIS — R509 Fever, unspecified: Secondary | ICD-10-CM

## 2017-07-14 DIAGNOSIS — R634 Abnormal weight loss: Secondary | ICD-10-CM

## 2017-07-14 DIAGNOSIS — B59 Pneumocystosis: Secondary | ICD-10-CM

## 2017-07-14 DIAGNOSIS — R062 Wheezing: Secondary | ICD-10-CM

## 2017-07-14 DIAGNOSIS — Z87891 Personal history of nicotine dependence: Secondary | ICD-10-CM

## 2017-07-14 DIAGNOSIS — B379 Candidiasis, unspecified: Secondary | ICD-10-CM

## 2017-07-14 LAB — CBC WITH DIFFERENTIAL/PLATELET
BASOS PCT: 0 %
Basophils Absolute: 0 10*3/uL (ref 0.0–0.1)
Eosinophils Absolute: 0 10*3/uL (ref 0.0–0.7)
Eosinophils Relative: 0 %
HEMATOCRIT: 27.1 % — AB (ref 36.0–46.0)
HEMOGLOBIN: 8.4 g/dL — AB (ref 12.0–15.0)
LYMPHS ABS: 0.8 10*3/uL (ref 0.7–4.0)
Lymphocytes Relative: 16 %
MCH: 22.3 pg — ABNORMAL LOW (ref 26.0–34.0)
MCHC: 31 g/dL (ref 30.0–36.0)
MCV: 72.1 fL — ABNORMAL LOW (ref 78.0–100.0)
MONOS PCT: 6 %
Monocytes Absolute: 0.3 10*3/uL (ref 0.1–1.0)
NEUTROS ABS: 3.9 10*3/uL (ref 1.7–7.7)
NEUTROS PCT: 78 %
Platelets: 231 10*3/uL (ref 150–400)
RBC: 3.76 MIL/uL — ABNORMAL LOW (ref 3.87–5.11)
RDW: 18.7 % — ABNORMAL HIGH (ref 11.5–15.5)
WBC: 5.1 10*3/uL (ref 4.0–10.5)

## 2017-07-14 LAB — BASIC METABOLIC PANEL
ANION GAP: 7 (ref 5–15)
BUN: 9 mg/dL (ref 6–20)
CALCIUM: 8.1 mg/dL — AB (ref 8.9–10.3)
CHLORIDE: 104 mmol/L (ref 101–111)
CO2: 25 mmol/L (ref 22–32)
Creatinine, Ser: 0.72 mg/dL (ref 0.44–1.00)
GFR calc non Af Amer: >60 mL/min (ref >60)
Glucose, Bld: 193 mg/dL — ABNORMAL HIGH (ref 65–99)
POTASSIUM: 3.1 mmol/L — AB (ref 3.5–5.1)
Sodium: 136 mmol/L (ref 135–145)

## 2017-07-14 LAB — FERRITIN: Ferritin: 23 ng/mL (ref 11–307)

## 2017-07-14 LAB — VITAMIN B12: Vitamin B-12: 181 pg/mL (ref 180–914)

## 2017-07-14 LAB — IRON AND TIBC
Iron: 9 ug/dL — ABNORMAL LOW (ref 28–170)
SATURATION RATIOS: 3 % — AB (ref 10.4–31.8)
TIBC: 260 ug/dL (ref 250–450)
UIBC: 251 ug/dL

## 2017-07-14 LAB — MAGNESIUM: Magnesium: 1.6 mg/dL — ABNORMAL LOW (ref 1.7–2.4)

## 2017-07-14 LAB — FOLATE: FOLATE: 10.7 ng/mL (ref >5.9)

## 2017-07-14 MED ORDER — PREDNISONE 20 MG PO TABS: 40.0000 mg | ORAL_TABLET | Freq: Two times a day (BID) | ORAL | Status: DC

## 2017-07-14 MED ORDER — PREDNISONE 20 MG PO TABS
40.0000 mg | ORAL_TABLET | Freq: Two times a day (BID) | ORAL | Status: AC
  Administered 2017-07-14 – 2017-07-19 (×10): 40 mg via ORAL
  Filled 2017-07-14 (×10): qty 2

## 2017-07-14 MED ORDER — NICOTINE POLACRILEX 2 MG MT GUM
2.0000 mg | CHEWING_GUM | OROMUCOSAL | Status: DC | PRN
  Filled 2017-07-14: qty 1

## 2017-07-14 MED ORDER — ENOXAPARIN SODIUM 40 MG/0.4ML ~~LOC~~ SOLN
40.0000 mg | SUBCUTANEOUS | Status: DC
  Administered 2017-07-14 – 2017-07-18 (×5): 40 mg via SUBCUTANEOUS
  Filled 2017-07-14 (×5): qty 0.4

## 2017-07-14 MED ORDER — NICOTINE 7 MG/24HR TD PT24
7.0000 mg | MEDICATED_PATCH | Freq: Every day | TRANSDERMAL | Status: DC
  Filled 2017-07-14 (×3): qty 1

## 2017-07-14 MED ORDER — SULFAMETHOXAZOLE-TRIMETHOPRIM 400-80 MG/5ML IV SOLN
320.0000 mg | Freq: Four times a day (QID) | INTRAVENOUS | Status: DC
  Administered 2017-07-14 – 2017-07-17 (×12): 320 mg via INTRAVENOUS
  Filled 2017-07-14 (×13): qty 20

## 2017-07-14 MED ORDER — MAGNESIUM SULFATE 4 GM/100ML IV SOLN
4.0000 g | Freq: Once | INTRAVENOUS | Status: AC
  Administered 2017-07-14: 4 g via INTRAVENOUS
  Filled 2017-07-14: qty 100

## 2017-07-14 MED ORDER — PREDNISONE 20 MG PO TABS: 40.0000 mg | ORAL_TABLET | Freq: Every day | ORAL | Status: DC

## 2017-07-14 MED ORDER — AZITHROMYCIN 600 MG PO TABS
1200.0000 mg | ORAL_TABLET | ORAL | Status: DC
  Administered 2017-07-15: 1200 mg via ORAL
  Filled 2017-07-14: qty 2

## 2017-07-14 MED ORDER — POTASSIUM CHLORIDE CRYS ER 20 MEQ PO TBCR
40.0000 meq | EXTENDED_RELEASE_TABLET | ORAL | Status: AC
  Administered 2017-07-14 (×2): 40 meq via ORAL
  Filled 2017-07-14 (×2): qty 2

## 2017-07-14 MED ORDER — ACETAMINOPHEN 325 MG PO TABS
650.0000 mg | ORAL_TABLET | Freq: Four times a day (QID) | ORAL | Status: DC | PRN
  Administered 2017-07-14 – 2017-07-18 (×6): 650 mg via ORAL
  Filled 2017-07-14 (×5): qty 2

## 2017-07-14 MED ORDER — PREDNISONE 20 MG PO TABS
20.0000 mg | ORAL_TABLET | Freq: Every day | ORAL | Status: DC
Start: 2017-07-25 — End: 2017-07-19

## 2017-07-14 NOTE — Progress Notes (Signed)
Pharmacy Antibiotic Note  Arvid RightJamie Loman is a 47 y.o. female admitted on 07/13/2017 with PCP.  Pharmacy has been consulted for Bactrim IV dosing in AIDS patient  Plan: 1) Bactrim IV 15-20mg /kg IV in 3 to 4 divided doses 2) Monitor CBC, SCr, K closely  Height: 5\' 6"  (167.6 cm) Weight: 166 lb 3.6 oz (75.4 kg) IBW/kg (Calculated) : 59.3  Temp (24hrs), Avg:99.8 F (37.7 C), Min:98.8 F (37.1 C), Max:101.2 F (38.4 C)  Recent Labs  Lab 07/12/17 2045 07/14/17 0343  WBC 6.2 5.1  CREATININE 0.80 0.72    Estimated Creatinine Clearance: 91.1 mL/min (by C-G formula based on SCr of 0.72 mg/dL).    Allergies  Allergen Reactions  . Other Anaphylaxis and Other (See Comments)    No Seafood      Thank you for allowing pharmacy to be a part of this patient's care.   Hessie KnowsJustin M Zyasia Halbleib, PharmD, BCPS Pager [phone] 07/14/2017 1:53 PM

## 2017-07-14 NOTE — Progress Notes (Signed)
PROGRESS NOTE    Grace Randall  RUE:[id number] DOB: [birth year] DOA: 07/13/2017 PCP: System, Pcp Not In   Brief Narrative:  Per HPI Grace Randall is Grace Randall 47 y.o. female with medical history significant of HIV off medications for over 2 years, anxiety, depression comes in with over Grace Randall month of coughing that is progressively worsened.  She reports Grace Randall lot of diarrhea also.  She did not know she was running fever her temperature here is almost 102.  She denies any flulike symptoms.  She denies any abdominal pain.  She has not had any nausea or vomiting.  Patient reports she is been off of her meds because she did not like taking them.  She was previously getting her care in [place] she is currently living in [place].  Patient is very upset that I have discussing her HIV status with her.  She is refusing to see an ID doctor and does not want to establish care with anyone for her HIV.  She says "I just do not want to do with that right now".  Patient is referred for admission for bilateral pneumonia.  She is hypoxic at 85% on room air.  Assessment & Plan:   Principal Problem:   PNA (pneumonia) Active Problems:   Acute respiratory failure with hypoxia (HCC)   HIV (human immunodeficiency virus infection) (HCC)   Diarrhea   Anxiety   Depression  Acute Hypoxic Resp Failure secondary to Likely PCP Pneumonia:  CT scan concerning for PCP pneumonia.   Appreciate ID assistance Bactrim  Prednisone Follow PCP DFA Urine strep and legionella pending Negative Flu    HIV/AIDS: CD4 count 50.  Not on any medications for several years.   Appreciate ID assistance Weekly azithro Start biktarvy Follow HIV genotype, RNA Hepatitis panel, RPR, GC/Chlam  *Do not discuss diagnosis with anyone else in room.  Pt concerned with privacy.  Discussed that we would not discuss with anyone unless she gave her consent.*  Diarrhea: follow stool cx.  Seems like this may be decreasing.  Smoking: encouraged cessation    Patch and gum  Anxiety and depression: seems stable, pt not on any medications  DVT prophylaxis: lovenox Code Status: full  Family Communication: none at bedside Disposition Plan: pending improvement   Consultants:   ID  Procedures:   none  Antimicrobials:  Anti-infectives (From admission, onward)   Start     Dose/Rate Route Frequency Ordered Stop   07/15/17 1000  azithromycin (ZITHROMAX) tablet 1,200 mg     1,200 mg Oral Weekly 07/14/17 1348     07/14/17 1600  sulfamethoxazole-trimethoprim (BACTRIM) 320 mg in dextrose 5 % 500 mL IVPB     320 mg 346.7 mL/hr over 90 Minutes Intravenous Every 6 hours 07/14/17 1359     07/14/17 0800  azithromycin (ZITHROMAX) 500 mg in sodium chloride 0.9 % 250 mL IVPB  Status:  Discontinued     500 mg 250 mL/hr over 60 Minutes Intravenous Every 24 hours 07/13/17 0812 07/14/17 1348   07/14/17 0600  cefTRIAXone (ROCEPHIN) 1 g in sodium chloride 0.9 % 100 mL IVPB  Status:  Discontinued     1 g 200 mL/hr over 30 Minutes Intravenous Every 24 hours 07/13/17 0812 07/14/17 1348   07/13/17 0530  cefTRIAXone (ROCEPHIN) 1 g in sodium chloride 0.9 % 100 mL IVPB     1 g 200 mL/hr over 30 Minutes Intravenous  Once 07/13/17 0518 07/13/17 0658   07/13/17 0530  azithromycin (ZITHROMAX) 500 mg in sodium  chloride 0.9 % 250 mL IVPB     500 mg 250 mL/hr over 60 Minutes Intravenous  Once 07/13/17 0518 07/13/17 0811         Subjective: No complaints. Asking for some cold orange juice. Not on meds for years.  Diagnosed around 2008. Doesn't want to talk about why she's not taking medications. Does not want any information shared with family.   Objective: Vitals:   07/13/17 0925 07/13/17 1323 07/13/17 2048 07/14/17 0428  BP: (!) 107/55 (!) 100/53 105/63 (!) 96/56  Pulse: 77 71 83 80  Resp: 20 16 18 18   Temp: 99.2 F (37.3 C) 98.5 F (36.9 C) (!) 101.2 F (38.4 C) 98.8 F (37.1 C)  TempSrc: Oral Oral Oral Oral  SpO2: 97% 91% 92% 93%  Weight: 69.9  kg (154 lb)   75.4 kg (166 lb 3.6 oz)  Height: 5\' 6"  (1.676 m)       Intake/Output Summary (Last 24 hours) at 07/14/2017 0858 Last data filed at 07/13/2017 1900 Gross per 24 hour  Intake 480 ml  Output -  Net 480 ml   Filed Weights   07/13/17 0925 07/14/17 0428  Weight: 69.9 kg (154 lb) 75.4 kg (166 lb 3.6 oz)    Examination:  General exam: Appears calm and comfortable  Respiratory system: Clear to auscultation. Respiratory effort normal.  [place]. Cardiovascular system: S1 & S2 heard, RRR. No JVD, murmurs, rubs, gallops or clicks. No pedal edema. Gastrointestinal system: Abdomen is nondistended, soft and nontender. No organomegaly or masses felt. Normal bowel sounds heard. Central nervous system: Alert and oriented. No focal neurological deficits. Extremities: Symmetric 5 x 5 power. Skin: No rashes, lesions or ulcers Psychiatry: Judgement and insight appear normal. Mood & affect appropriate.     Data Reviewed: I have personally reviewed following labs and imaging studies  CBC: Recent Labs  Lab 07/12/17 2045 07/14/17 0343  WBC 6.2 5.1  NEUTROABS  --  3.9  HGB 9.6* 8.4*  HCT 31.6* 27.1*  MCV 72.6* 72.1*  PLT 245 231   Basic Metabolic Panel: Recent Labs  Lab 07/12/17 2045 07/14/17 0343  NA 135 136  K 3.2* 3.1*  CL 101 104  CO2 23 25  GLUCOSE 93 193*  BUN 10 9  CREATININE 0.80 0.72  CALCIUM 8.4* 8.1*   GFR: Estimated Creatinine Clearance: 91.1 mL/min (by C-G formula based on SCr of 0.72 mg/dL). Liver Function Tests: Recent Labs  Lab 07/12/17 2045  AST 34  ALT 13*  ALKPHOS 66  BILITOT 0.7  PROT 7.4  ALBUMIN 3.0*   Recent Labs  Lab 07/12/17 2045  LIPASE 26   No results for input(s): AMMONIA in the last 168 hours. Coagulation Profile: No results for input(s): INR, PROTIME in the last 168 hours. Cardiac Enzymes: No results for input(s): CKTOTAL, CKMB, CKMBINDEX, TROPONINI in the last 168 hours. BNP (last 3 results) No results for input(s): PROBNP in  the last 8760 hours. HbA1C: No results for input(s): HGBA1C in the last 72 hours. CBG: No results for input(s): GLUCAP in the last 168 hours. Lipid Profile: No results for input(s): CHOL, HDL, LDLCALC, TRIG, CHOLHDL, LDLDIRECT in the last 72 hours. Thyroid Function Tests: No results for input(s): TSH, T4TOTAL, FREET4, T3FREE, THYROIDAB in the last 72 hours. Anemia Panel: No results for input(s): VITAMINB12, FOLATE, FERRITIN, TIBC, IRON, RETICCTPCT in the last 72 hours. Sepsis Labs: No results for input(s): PROCALCITON, LATICACIDVEN in the last 168 hours.  No results found for this or any  previous visit (from the past 240 hour(s)).       Radiology Studies: Dg Chest 2 View  Result Date: 07/12/2017 CLINICAL DATA:  Cough EXAM: CHEST  2 VIEW COMPARISON:  None. FINDINGS: Suggestion of mild perihilar ground-glass opacity. No consolidation or pleural effusion. Normal heart size. No pneumothorax. IMPRESSION: Possible subtle perihilar ground-glass infiltrates, CT would be more confirmatory. Otherwise negative two-view chest. Electronically Signed   By: Jasmine PangKim  Fujinaga M.D.   On: 07/12/2017 20:55   Ct Chest W Contrast  Result Date: 07/13/2017 CLINICAL DATA:  47 year old female with cough.  History of HIV. EXAM: CT CHEST WITH CONTRAST TECHNIQUE: Multidetector CT imaging of the chest was performed during intravenous contrast administration. CONTRAST:  75mL ISOVUE-300 IOPAMIDOL (ISOVUE-300) INJECTION 61% COMPARISON:  Chest radiograph dated 07/12/2017 FINDINGS: Cardiovascular: There is no cardiomegaly or pericardial effusion. The thoracic aorta is unremarkable. The origins of the great vessels of the aortic arch are patent. Evaluation of the pulmonary arteries is limited due to suboptimal enhancement and timing of the contrast. This study is essentially nondiagnostic for evaluation of pulmonary embolism. Mediastinum/Nodes: No hilar adenopathy. Anterior paratracheal lymph node measures 13 mm in short axis.  The esophagus is grossly unremarkable. Heterogeneous appearance of the left thyroid gland and probable subcentimeter left thyroid nodule. Ultrasound may provide better evaluation. No mediastinal fluid collection. Lungs/Pleura: There is subpleural emphysema with small cystic changes of the upper lobes. Diffuse bilateral perihilar and upper lobe predominant ground-glass density throughout the lungs concerning for pneumonia, possibly multifocal bacterial infection or related to an opportunistic agent such as pneumocystis pneumonia (PCP) or CMV pneumonia. Non infectious etiology such as lymphocytic interstitial pneumonitis is less likely given rebound upper lobe involvement. Clinical correlation and pulmonary consult is advised. There is no pleural effusion or pneumothorax. The central airways are patent. Upper Abdomen: No acute abnormality. Musculoskeletal: No chest wall abnormality. No acute or significant osseous findings. IMPRESSION: Diffuse bilateral and upper lobe predominant ground-glass and hazy airspace density concerning for pneumonia. Clinical correlation and pulmonary consult is advised. Electronically Signed   By: Elgie CollardArash  Radparvar M.D.   On: 07/13/2017 07:01        Scheduled Meds: . potassium chloride  40 mEq Oral Q4H  . sodium chloride flush  3 mL Intravenous Q12H   Continuous Infusions: . sodium chloride 250 mL (07/14/17 0551)  . azithromycin    . cefTRIAXone (ROCEPHIN)  IV Stopped (07/14/17 [id number])     LOS: 1 day    Time spent: over 30 min    Lacretia Nicksaldwell Powell, MD [facility] Pager [phone]  If 7PM-7AM, please contact night-coverage [email or web address] Password [email or web address] 07/14/2017, 8:58 AM

## 2017-07-14 NOTE — Consult Note (Signed)
[facility] for Infectious Disease    Date of Admission:  07/13/2017   Total days of antibiotics: 1 ceftriaxone               Reason for Consult: AIDS    Referring Provider: Florene Glen   Assessment: AIDS Pneumonia, suspect PCP Tobacco use HSV Candidal rash  Plan: 1. Check HIV RNA and genotype 2. Start biktarvy 3. Start prednisone and bactrim 4. PCP DFA 5. Weekly azithro 6. Topicals for abd rash    CD4 T Cell Abs (/uL)  Date Value  07/13/2017 50 (L)     Thank you so much for this interesting consult,  Principal Problem:   PNA (pneumonia) Active Problems:   Acute respiratory failure with hypoxia (HCC)   HIV (human immunodeficiency virus infection) ([place])   Diarrhea   Anxiety   Depression   . potassium chloride  40 mEq Oral Q4H  . sodium chloride flush  3 mL Intravenous Q12H    HPI: Grace Randall is a 47 y.o. female with hx of HIV+ dx (2008) while she was living in [place]. She was married to a HIV+ man who did not disclose his status. Her only prev rx is atripla, she has been off for several years. She had previous genotype that showed pan-sens virus. She did have diarrhea with the atripla.   She comes to hospital 3-1 with 3-4 months of feeling poorly. She developed worsening sob over last month and then temp to 102. Cough that was non-productive. Progressive SOB, DOE.  In ED she had temp 101.5 and had SpO2 90%. She had CT showing: Diffuse bilateral and upper lobe predominant ground-glass and hazy airspace density concerning for pneumonia.   Her last cigarette was 2 weeks ago.  She has also had intertriginous rash on lower abd.    The past medical history, family history and social history were reviewed/updated in EPIC   Review of Systems: Review of Systems  Constitutional: Positive for chills, fever and weight loss.  Respiratory: Positive for cough and shortness of breath.   Gastrointestinal: Positive for diarrhea. Negative for constipation, nausea  and vomiting.  Genitourinary: Negative for dysuria.  Skin: Positive for rash.  Please see HPI. All other systems reviewed and negative.   Past Medical History:  Diagnosis Date  . Anxiety   . Depression   . HIV (human immunodeficiency virus infection) ([place]) 2008    Social History   Tobacco Use  . Smoking status: Former Research scientist (life sciences)  . Smokeless tobacco: Never Used  Substance Use Topics  . Alcohol use: No    Frequency: Never  . Drug use: Yes    Types: Marijuana    Family History  Problem Relation Age of Onset  . COPD Mother   . Prostate cancer Father    The past medical history, family history and social history were reviewed/updated in EPIC   Medications:  Scheduled: . potassium chloride  40 mEq Oral Q4H  . sodium chloride flush  3 mL Intravenous Q12H    Abtx:  Anti-infectives (From admission, onward)   Start     Dose/Rate Route Frequency Ordered Stop   07/14/17 0800  azithromycin (ZITHROMAX) 500 mg in sodium chloride 0.9 % 250 mL IVPB     500 mg 250 mL/hr over 60 Minutes Intravenous Every 24 hours 07/13/17 0812 07/21/17 0759   07/14/17 0600  cefTRIAXone (ROCEPHIN) 1 g in sodium chloride 0.9 % 100 mL IVPB     1  g 200 mL/hr over 30 Minutes Intravenous Every 24 hours 07/13/17 0812 07/21/17 0559   07/13/17 0530  cefTRIAXone (ROCEPHIN) 1 g in sodium chloride 0.9 % 100 mL IVPB     1 g 200 mL/hr over 30 Minutes Intravenous  Once 07/13/17 0518 07/13/17 0658   07/13/17 0530  azithromycin (ZITHROMAX) 500 mg in sodium chloride 0.9 % 250 mL IVPB     500 mg 250 mL/hr over 60 Minutes Intravenous  Once 07/13/17 0518 07/13/17 0811        OBJECTIVE: Blood pressure (!) 96/56, pulse 80, temperature 98.8 F (37.1 C), temperature source Oral, resp. rate 18, height 5' 6"  (1.676 m), weight 75.4 kg (166 lb 3.6 oz), last menstrual period 07/12/2017, SpO2 93 %.  Physical Exam  Constitutional: She is well-developed, well-nourished, and in no distress.  HENT:  Mouth/Throat: No  oropharyngeal exudate.  Eyes: EOM are normal. Pupils are equal, round, and reactive to light.  Neck: Neck supple.  Cardiovascular: Normal rate, regular rhythm and normal heart sounds.  Pulmonary/Chest: Effort normal. No tachypnea. She has wheezes.      Abdominal: Soft. Bowel sounds are normal. There is no tenderness.  Musculoskeletal: She exhibits no edema.  Lymphadenopathy:    She has no cervical adenopathy.    She has no axillary adenopathy.  Psychiatric: Affect normal.    Lab Results Results for orders placed or performed during the hospital encounter of 07/13/17 (from the past 48 hour(s))  Lipase, blood     Status: None   Collection Time: 07/12/17  8:45 PM  Result Value Ref Range   Lipase 26 11 - 51 U/L    Comment: Performed at [facility], [place] [address], [place], [place] [address]  Comprehensive metabolic panel     Status: Abnormal   Collection Time: 07/12/17  8:45 PM  Result Value Ref Range   Sodium 135 135 - 145 mmol/L   Potassium 3.2 (L) 3.5 - 5.1 mmol/L   Chloride 101 101 - 111 mmol/L   CO2 23 22 - 32 mmol/L   Glucose, Bld 93 65 - 99 mg/dL   BUN 10 6 - 20 mg/dL   Creatinine, Ser 0.80 0.44 - 1.00 mg/dL   Calcium 8.4 (L) 8.9 - 10.3 mg/dL   Total Protein 7.4 6.5 - 8.1 g/dL   Albumin 3.0 (L) 3.5 - 5.0 g/dL   AST 34 15 - 41 U/L   ALT 13 (L) 14 - 54 U/L   Alkaline Phosphatase 66 38 - 126 U/L   Total Bilirubin 0.7 0.3 - 1.2 mg/dL   GFR calc non Af Amer >60 >60 mL/min   GFR calc Af Amer >60 >60 mL/min    Comment: (NOTE) The eGFR has been calculated using the CKD EPI equation. This calculation has not been validated in all clinical situations. eGFR's persistently <60 mL/min signify possible Chronic Kidney Disease.    Anion gap 11 5 - 15    Comment: Performed at [facility], [place] [address], [place], [place] [address]  CBC     Status: Abnormal   Collection Time: 07/12/17  8:45 PM  Result Value Ref Range   WBC 6.2 4.0 - 10.5  K/uL   RBC 4.35 3.87 - 5.11 MIL/uL   Hemoglobin 9.6 (L) 12.0 - 15.0 g/dL   HCT 31.6 (L) 36.0 - 46.0 %   MCV 72.6 (L) 78.0 - 100.0 fL   MCH 22.1 (L) 26.0 - 34.0 pg   MCHC 30.4 30.0 -  36.0 g/dL   RDW 18.8 (H) 11.5 - 15.5 %   Platelets 245 150 - 400 K/uL    Comment: Performed at [facility], [place] [address] [place], [place] [address]  Influenza panel by PCR (type A & B)     Status: None   Collection Time: 07/13/17  7:22 AM  Result Value Ref Range   Influenza A By PCR NEGATIVE NEGATIVE   Influenza B By PCR NEGATIVE NEGATIVE    Comment: (NOTE) The Xpert Xpress Flu assay is intended as an aid in the diagnosis of  influenza and should not be used as a sole basis for treatment.  This  assay is FDA approved for nasopharyngeal swab specimens only. Nasal  washings and aspirates are unacceptable for Xpert Xpress Flu testing. Performed at [facility], [place] [address], [place], [place] [address]   Urinalysis, Routine w reflex microscopic     Status: Abnormal   Collection Time: 07/13/17  7:24 AM  Result Value Ref Range   Color, Urine YELLOW YELLOW   APPearance CLEAR CLEAR   Specific Gravity, Urine 1.035 (H) 1.005 - 1.030   pH 6.0 5.0 - 8.0   Glucose, UA NEGATIVE NEGATIVE mg/dL   Hgb urine dipstick MODERATE (A) NEGATIVE   Bilirubin Urine NEGATIVE NEGATIVE   Ketones, ur 20 (A) NEGATIVE mg/dL   Protein, ur NEGATIVE NEGATIVE mg/dL   Nitrite NEGATIVE NEGATIVE   Leukocytes, UA TRACE (A) NEGATIVE   RBC / HPF 0-5 0 - 5 RBC/hpf   WBC, UA 0-5 0 - 5 WBC/hpf   Bacteria, UA RARE (A) NONE SEEN   Squamous Epithelial / LPF 0-5 (A) NONE SEEN   Mucus PRESENT     Comment: Performed at [facility], [place] [address], [place],  [address]  POC Urine Pregnancy, ED (do NOT order at [facility])     Status: None   Collection Time: 07/13/17  7:37 AM  Result Value Ref Range   Preg Test, Ur NEGATIVE NEGATIVE    Comment:        THE SENSITIVITY OF  THIS METHODOLOGY IS >24 mIU/mL   T-helper cells (CD4) count (not at [facility])     Status: Abnormal   Collection Time: 07/13/17 10:29 AM  Result Value Ref Range   CD4 T Cell Abs 50 (L) 400 - 2,700 /uL   CD4 % Helper T Cell 8 (L) 33 - 55 %    Comment: Performed at [facility], [place] [address], [place],  [address]  Basic metabolic panel     Status: Abnormal   Collection Time: 07/14/17  3:43 AM  Result Value Ref Range   Sodium 136 135 - 145 mmol/L   Potassium 3.1 (L) 3.5 - 5.1 mmol/L   Chloride 104 101 - 111 mmol/L   CO2 25 22 - 32 mmol/L   Glucose, Bld 193 (H) 65 - 99 mg/dL   BUN 9 6 - 20 mg/dL   Creatinine, Ser 0.72 0.44 - 1.00 mg/dL   Calcium 8.1 (L) 8.9 - 10.3 mg/dL   GFR calc non Af Amer >60 >60 mL/min   GFR calc Af Amer >60 >60 mL/min    Comment: (NOTE) The eGFR has been calculated using the CKD EPI equation. This calculation has not been validated in all clinical situations. eGFR's persistently <60 mL/min signify possible Chronic Kidney Disease.    Anion gap 7 5 - 15    Comment: Performed at [facility], [address] [place], [place] [address]  CBC WITH DIFFERENTIAL     Status: Abnormal   Collection Time: 07/14/17  3:43 AM  Result Value Ref Range   WBC 5.1 4.0 - 10.5 K/uL   RBC 3.76 (L) 3.87 - 5.11 MIL/uL   Hemoglobin 8.4 (L) 12.0 - 15.0 g/dL   HCT 27.1 (L) 36.0 - 46.0 %   MCV 72.1 (L) 78.0 - 100.0 fL   MCH 22.3 (L) 26.0 - 34.0 pg   MCHC 31.0 30.0 - 36.0 g/dL   RDW 18.7 (H) 11.5 - 15.5 %   Platelets 231 150 - 400 K/uL   Neutrophils Relative % 78 %   Neutro Abs 3.9 1.7 - 7.7 K/uL   Lymphocytes Relative 16 %   Lymphs Abs 0.8 0.7 - 4.0 K/uL   Monocytes Relative 6 %   Monocytes Absolute 0.3 0.1 - 1.0 K/uL   Eosinophils Relative 0 %   Eosinophils Absolute 0.0 0.0 - 0.7 K/uL   Basophils Relative 0 %   Basophils Absolute 0.0 0.0 - 0.1 K/uL    Comment: Performed at [facility], [place] [address],  [place], [place] [address]  Magnesium     Status: Abnormal   Collection Time: 07/14/17  9:26 AM  Result Value Ref Range   Magnesium 1.6 (L) 1.7 - 2.4 mg/dL    Comment: Performed at [facility], [place] [address] [place], [place] [address]   No results found for: SDES, SPECREQUEST, CULT, REPTSTATUS Dg Chest 2 View  Result Date: 07/12/2017 CLINICAL DATA:  Cough EXAM: CHEST  2 VIEW COMPARISON:  None. FINDINGS: Suggestion of mild perihilar ground-glass opacity. No consolidation or pleural effusion. Normal heart size. No pneumothorax. IMPRESSION: Possible subtle perihilar ground-glass infiltrates, CT would be more confirmatory. Otherwise negative two-view chest. Electronically Signed   By: Donavan Foil M.D.   On: 07/12/2017 20:55   Ct Chest W Contrast  Result Date: 07/13/2017 CLINICAL DATA:  47 year old female with cough.  History of HIV. EXAM: CT CHEST WITH CONTRAST TECHNIQUE: Multidetector CT imaging of the chest was performed during intravenous contrast administration. CONTRAST:  56m ISOVUE-300 IOPAMIDOL (ISOVUE-300) INJECTION 61% COMPARISON:  Chest radiograph dated 07/12/2017 FINDINGS: Cardiovascular: There is no cardiomegaly or pericardial effusion. The thoracic aorta is unremarkable. The origins of the great vessels of the aortic arch are patent. Evaluation of the pulmonary arteries is limited due to suboptimal enhancement and timing of the contrast. This study is essentially nondiagnostic for evaluation of pulmonary embolism. Mediastinum/Nodes: No hilar adenopathy. Anterior paratracheal lymph node measures 13 mm in short axis. The esophagus is grossly unremarkable. Heterogeneous appearance of the left thyroid gland and probable subcentimeter left thyroid nodule. Ultrasound may provide better evaluation. No mediastinal fluid collection. Lungs/Pleura: There is subpleural emphysema with small cystic changes of the upper lobes. Diffuse bilateral perihilar and upper lobe predominant  ground-glass density throughout the lungs concerning for pneumonia, possibly multifocal bacterial infection or related to an opportunistic agent such as pneumocystis pneumonia (PCP) or CMV pneumonia. Non infectious etiology such as lymphocytic interstitial pneumonitis is less likely given rebound upper lobe involvement. Clinical correlation and pulmonary consult is advised. There is no pleural effusion or pneumothorax. The central airways are patent. Upper Abdomen: No acute abnormality. Musculoskeletal: No chest wall abnormality. No acute or significant osseous findings. IMPRESSION: Diffuse bilateral and upper lobe predominant ground-glass and hazy airspace density concerning for pneumonia. Clinical correlation and pulmonary consult is advised. Electronically Signed   By: ALaren EvertsD.  On: 07/13/2017 07:01   No results found for this or any previous visit (from the past 240 hour(s)).  Microbiology: No results found for this or any previous visit (from the past 240 hour(s)).  Radiographs and labs were personally reviewed by me.   Bobby Rumpf, MD [facility] for [facility] [place] [facility] [phone] 07/14/2017, 1:38 PM

## 2017-07-15 DIAGNOSIS — R0600 Dyspnea, unspecified: Secondary | ICD-10-CM

## 2017-07-15 DIAGNOSIS — B20 Human immunodeficiency virus [HIV] disease: Principal | ICD-10-CM

## 2017-07-15 LAB — BASIC METABOLIC PANEL
Anion gap: 8 (ref 5–15)
BUN: 8 mg/dL (ref 6–20)
CALCIUM: 8.3 mg/dL — AB (ref 8.9–10.3)
CO2: 26 mmol/L (ref 22–32)
CREATININE: 0.66 mg/dL (ref 0.44–1.00)
Chloride: 103 mmol/L (ref 101–111)
GFR calc non Af Amer: >60 mL/min (ref >60)
Glucose, Bld: 145 mg/dL — ABNORMAL HIGH (ref 65–99)
Potassium: 4.3 mmol/L (ref 3.5–5.1)
SODIUM: 137 mmol/L (ref 135–145)

## 2017-07-15 LAB — RPR: RPR: NONREACTIVE

## 2017-07-15 LAB — CBC
HCT: 30.8 % — ABNORMAL LOW (ref 36.0–46.0)
HEMOGLOBIN: 9.1 g/dL — AB (ref 12.0–15.0)
MCH: 21.7 pg — ABNORMAL LOW (ref 26.0–34.0)
MCHC: 29.5 g/dL — AB (ref 30.0–36.0)
MCV: 73.5 fL — ABNORMAL LOW (ref 78.0–100.0)
Platelets: 272 10*3/uL (ref 150–400)
RBC: 4.19 MIL/uL (ref 3.87–5.11)
RDW: 19.3 % — AB (ref 11.5–15.5)
WBC: 4.1 10*3/uL (ref 4.0–10.5)

## 2017-07-15 LAB — HEPATITIS PANEL, ACUTE
HCV Ab: 0.1 s/co ratio (ref 0.0–0.9)
HEP A IGM: NEGATIVE
HEP B C IGM: NEGATIVE
HEP B S AG: NEGATIVE

## 2017-07-15 LAB — MAGNESIUM: MAGNESIUM: 2.4 mg/dL (ref 1.7–2.4)

## 2017-07-15 LAB — STREP PNEUMONIAE URINARY ANTIGEN: STREP PNEUMO URINARY ANTIGEN: NEGATIVE

## 2017-07-15 MED ORDER — SODIUM CHLORIDE 0.9 % IV SOLN
510.0000 mg | Freq: Once | INTRAVENOUS | Status: AC
  Administered 2017-07-15: 510 mg via INTRAVENOUS
  Filled 2017-07-15: qty 17

## 2017-07-15 MED ORDER — FERROUS SULFATE 325 (65 FE) MG PO TABS
325.0000 mg | ORAL_TABLET | Freq: Two times a day (BID) | ORAL | Status: DC
  Administered 2017-07-15 – 2017-07-19 (×8): 325 mg via ORAL
  Filled 2017-07-15 (×8): qty 1

## 2017-07-15 MED ORDER — BICTEGRAVIR-EMTRICITAB-TENOFOV 50-200-25 MG PO TABS
1.0000 | ORAL_TABLET | Freq: Every day | ORAL | Status: DC
  Administered 2017-07-15 – 2017-07-19 (×5): 1 via ORAL
  Filled 2017-07-15 (×5): qty 1

## 2017-07-15 MED ORDER — HEPATITIS B VAC RECOMBINANT 5 MCG/0.5ML IJ SUSP
1.0000 mL | Freq: Once | INTRAMUSCULAR | Status: AC
  Administered 2017-07-16: 1 mL via INTRAMUSCULAR
  Filled 2017-07-15: qty 1

## 2017-07-15 NOTE — Progress Notes (Signed)
INFECTIOUS DISEASE PROGRESS NOTE  ID: Grace Randall is a 47 y.o. female with  Principal Problem:   PNA (pneumonia) Active Problems:   Acute respiratory failure with hypoxia (HCC)   HIV (human immunodeficiency virus infection) (HCC)   Diarrhea   Anxiety   Depression  Subjective: C/o doe Eating well stool becoming more solid.  She is unclear if she has gotten ART yet.   Abtx:  Anti-infectives (From admission, onward)   Start     Dose/Rate Route Frequency Ordered Stop   07/15/17 1000  azithromycin (ZITHROMAX) tablet 1,200 mg     1,200 mg Oral Weekly 07/14/17 1348     07/14/17 1600  sulfamethoxazole-trimethoprim (BACTRIM) 320 mg in dextrose 5 % 500 mL IVPB     320 mg 346.7 mL/hr over 90 Minutes Intravenous Every 6 hours 07/14/17 1359     07/14/17 0800  azithromycin (ZITHROMAX) 500 mg in sodium chloride 0.9 % 250 mL IVPB  Status:  Discontinued     500 mg 250 mL/hr over 60 Minutes Intravenous Every 24 hours 07/13/17 0812 07/14/17 1348   07/14/17 0600  cefTRIAXone (ROCEPHIN) 1 g in sodium chloride 0.9 % 100 mL IVPB  Status:  Discontinued     1 g 200 mL/hr over 30 Minutes Intravenous Every 24 hours 07/13/17 0812 07/14/17 1348   07/13/17 0530  cefTRIAXone (ROCEPHIN) 1 g in sodium chloride 0.9 % 100 mL IVPB     1 g 200 mL/hr over 30 Minutes Intravenous  Once 07/13/17 0518 07/13/17 0658   07/13/17 0530  azithromycin (ZITHROMAX) 500 mg in sodium chloride 0.9 % 250 mL IVPB     500 mg 250 mL/hr over 60 Minutes Intravenous  Once 07/13/17 0518 07/13/17 0811      Medications:  Scheduled: . azithromycin  1,200 mg Oral Weekly  . enoxaparin (LOVENOX) injection  40 mg Subcutaneous Q24H  . nicotine  7 mg Transdermal Daily  . predniSONE  40 mg Oral BID WC   Followed by  . [START ON 07/20/2017] predniSONE  40 mg Oral Q breakfast   Followed by  . [START ON 07/25/2017] predniSONE  20 mg Oral Q breakfast  . sodium chloride flush  3 mL Intravenous Q12H    Objective: Vital signs in last 24  hours: Temp:  [97.9 F (36.6 C)-103 F (39.4 C)] 98.4 F (36.9 C) (03/03 1402) Pulse Rate:  [67-89] 67 (03/03 1402) Resp:  [14-16] 16 (03/03 1402) BP: (92-111)/(51-61) 94/51 (03/03 1402) SpO2:  [96 %-98 %] 98 % (03/03 1402)   General appearance: alert, cooperative and no distress Resp: clear to auscultation bilaterally Cardio: regular rate and rhythm GI: normal findings: bowel sounds normal and soft, non-tender  Lab Results Recent Labs    07/14/17 0343 07/15/17 0405  WBC 5.1 4.1  HGB 8.4* 9.1*  HCT 27.1* 30.8*  NA 136 137  K 3.1* 4.3  CL 104 103  CO2 25 26  BUN 9 8  CREATININE 0.72 0.66   Liver Panel Recent Labs    07/12/17 2045  PROT 7.4  ALBUMIN 3.0*  AST 34  ALT 13*  ALKPHOS 66  BILITOT 0.7   Sedimentation Rate No results for input(s): ESRSEDRATE in the last 72 hours. C-Reactive Protein No results for input(s): CRP in the last 72 hours.  Microbiology: Recent Results (from the past 240 hour(s))  Culture, blood (routine x 2) Call MD if unable to obtain prior to antibiotics being given     Status: None (Preliminary result)  Collection Time: 07/13/17 10:29 AM  Result Value Ref Range Status   Specimen Description   Final    BLOOD RIGHT HAND Performed at [facility], [address], [place], [place] [address]    Special Requests   Final    IN PEDIATRIC BOTTLE Blood Culture results may not be optimal due to an excessive volume of blood received in culture bottles Performed at [facility], [address] [place], [place] [address]    Culture   Final    NO GROWTH 1 DAY Performed at [facility], [address] [place], [place] [address]    Report Status PENDING  Incomplete  Culture, blood (routine x 2) Call MD if unable to obtain prior to antibiotics being given     Status: None (Preliminary result)   Collection Time: 07/13/17 10:30 AM  Result Value Ref Range Status   Specimen Description   Final    BLOOD  LEFT ARM Performed at [facility], [address], [place], [place] [address]    Special Requests   Final    BOTTLES DRAWN AEROBIC AND ANAEROBIC Blood Culture adequate volume Performed at [facility], [address], [place], [place] [address]    Culture   Final    NO GROWTH 1 DAY Performed at [facility], [address] [place], [place] [address]    Report Status PENDING  Incomplete    Studies/Results: No results found.   Assessment/Plan: AIDS Suspected PCP  Total days of antibiotics: 1 bactrim, prednisone,  0 ART (biktarvy)  Start ART today Continue her current anbx/prednisone Despite her claims of DOE, she has not been hypoxic on her flow sheet on RA Change her bactrim to PO if she does well.  Will f/u in AM         Johny Sax MD, FACP Infectious Diseases (pager) [phone] [email or web address] 07/15/2017, 2:31 PM  LOS: 2 days

## 2017-07-15 NOTE — Progress Notes (Addendum)
PROGRESS NOTE    Grace Randall  ZOX:[id number] DOB: [birth year] DOA: 07/13/2017 PCP: System, Pcp Not In   Brief Narrative:  Per HPI Grace Randall is Grace Randall 47 y.o. female with medical history significant of HIV off medications for over 2 years, anxiety, depression comes in with over Grace Randall month of coughing that is progressively worsened.  She reports Grace Randall lot of diarrhea also.  She did not know she was running fever her temperature here is almost 102.  She denies any flulike symptoms.  She denies any abdominal pain.  She has not had any nausea or vomiting.  Patient reports she is been off of her meds because she did not like taking them.  She was previously getting her care in [place] she is currently living in [place].  Patient is very upset that I have discussing her HIV status with her.  She is refusing to see an ID doctor and does not want to establish care with anyone for her HIV.  She says "I just do not want to do with that right now".  Patient is referred for admission for bilateral pneumonia.  She is hypoxic at 85% on room air.  Assessment & Plan:   Principal Problem:   PNA (pneumonia) Active Problems:   Acute respiratory failure with hypoxia (HCC)   HIV (human immunodeficiency virus infection) (HCC)   Diarrhea   Anxiety   Depression  Acute Hypoxic Resp Failure secondary to Likely PCP Pneumonia:  CT scan concerning for PCP pneumonia.   Appreciate ID assistance Bactrim  Prednisone Follow PCP DFA Urine strep negative and legionella pending Negative Flu    HIV/AIDS: CD4 count 50.  Not on any medications for several years.   Appreciate ID assistance Weekly azithro Start biktarvy Follow HIV genotype, RNA Hepatitis panel pending, RPR (negative), GC/Chlam pending  *Do not discuss diagnosis with anyone else in room.  Pt concerned with privacy.  Discussed that we would not discuss with anyone unless she gave her consent.*  Diarrhea: follow stool cx.  Seems like this may be decreasing.      Smoking: encouraged cessation  Patch and gum  Anxiety and depression: seems stable, pt not on any medications  Iron deficiency anemia: start iron, feraheme  DVT prophylaxis: lovenox Code Status: full  Family Communication: none at bedside Disposition Plan: pending improvement   Consultants:   ID  Procedures:   none  Antimicrobials:  Anti-infectives (From admission, onward)   Start     Dose/Rate Route Frequency Ordered Stop   07/15/17 1000  azithromycin (ZITHROMAX) tablet 1,200 mg     1,200 mg Oral Weekly 07/14/17 1348     07/14/17 1600  sulfamethoxazole-trimethoprim (BACTRIM) 320 mg in dextrose 5 % 500 mL IVPB     320 mg 346.7 mL/hr over 90 Minutes Intravenous Every 6 hours 07/14/17 1359     07/14/17 0800  azithromycin (ZITHROMAX) 500 mg in sodium chloride 0.9 % 250 mL IVPB  Status:  Discontinued     500 mg 250 mL/hr over 60 Minutes Intravenous Every 24 hours 07/13/17 0812 07/14/17 1348   07/14/17 0600  cefTRIAXone (ROCEPHIN) 1 g in sodium chloride 0.9 % 100 mL IVPB  Status:  Discontinued     1 g 200 mL/hr over 30 Minutes Intravenous Every 24 hours 07/13/17 0812 07/14/17 1348   07/13/17 0530  cefTRIAXone (ROCEPHIN) 1 g in sodium chloride 0.9 % 100 mL IVPB     1 g 200 mL/hr over 30 Minutes Intravenous  Once 07/13/17 0518  07/13/17 0658   07/13/17 0530  azithromycin (ZITHROMAX) 500 mg in sodium chloride 0.9 % 250 mL IVPB     500 mg 250 mL/hr over 60 Minutes Intravenous  Once 07/13/17 0518 07/13/17 0811         Subjective: Feels hot.  No other complaints.  Objective: Vitals:   07/14/17 1344 07/14/17 1859 07/14/17 1946 07/15/17 0420  BP: (!) 89/54  (!) 111/51 92/61  Pulse: 78  89 69  Resp: 18  16 14   Temp: 99.3 F (37.4 C) (!) 103 F (39.4 C) 100 F (37.8 C) 97.9 F (36.6 C)  TempSrc: Oral Oral Oral Oral  SpO2: 98%  96% 97%  Weight:      Height:        Intake/Output Summary (Last 24 hours) at 07/15/2017 1012 Last data filed at 07/14/2017 1300 Gross per  24 hour  Intake 480 ml  Output -  Net 480 ml   Filed Weights   07/13/17 0925 07/14/17 0428  Weight: 69.9 kg (154 lb) 75.4 kg (166 lb 3.6 oz)    Examination:  General: No acute distress. Cardiovascular: Heart sounds show Mellody Masri regular rate, and rhythm. No gallops or rubs. No murmurs. No JVD. Lungs: Clear to auscultation bilaterally with good air movement. No rales, rhonchi or wheezes. Abdomen: Soft, nontender, nondistended with normal active bowel sounds. No masses. No hepatosplenomegaly. Neurological: Alert and oriented 3. Moves all extremities 4. Cranial nerves II through XII grossly intact. Skin: Warm and dry. No rashes or lesions. Extremities: No clubbing or cyanosis. No edema.  Psychiatric: Mood and affect are normal. Insight and judgment are appropriate.    Data Reviewed: I have personally reviewed following labs and imaging studies  CBC: Recent Labs  Lab 07/12/17 2045 07/14/17 0343 07/15/17 0405  WBC 6.2 5.1 4.1  NEUTROABS  --  3.9  --   HGB 9.6* 8.4* 9.1*  HCT 31.6* 27.1* 30.8*  MCV 72.6* 72.1* 73.5*  PLT 245 231 272   Basic Metabolic Panel: Recent Labs  Lab 07/12/17 2045 07/14/17 0343 07/14/17 0926 07/15/17 0405  NA 135 136  --  137  K 3.2* 3.1*  --  4.3  CL 101 104  --  103  CO2 23 25  --  26  GLUCOSE 93 193*  --  145*  BUN 10 9  --  8  CREATININE 0.80 0.72  --  0.66  CALCIUM 8.4* 8.1*  --  8.3*  MG  --   --  1.6* 2.4   GFR: Estimated Creatinine Clearance: 91.1 mL/min (by C-G formula based on SCr of 0.66 mg/dL). Liver Function Tests: Recent Labs  Lab 07/12/17 2045  AST 34  ALT 13*  ALKPHOS 66  BILITOT 0.7  PROT 7.4  ALBUMIN 3.0*   Recent Labs  Lab 07/12/17 2045  LIPASE 26   No results for input(s): AMMONIA in the last 168 hours. Coagulation Profile: No results for input(s): INR, PROTIME in the last 168 hours. Cardiac Enzymes: No results for input(s): CKTOTAL, CKMB, CKMBINDEX, TROPONINI in the last 168 hours. BNP (last 3 results) No  results for input(s): PROBNP in the last 8760 hours. HbA1C: No results for input(s): HGBA1C in the last 72 hours. CBG: No results for input(s): GLUCAP in the last 168 hours. Lipid Profile: No results for input(s): CHOL, HDL, LDLCALC, TRIG, CHOLHDL, LDLDIRECT in the last 72 hours. Thyroid Function Tests: No results for input(s): TSH, T4TOTAL, FREET4, T3FREE, THYROIDAB in the last 72 hours. Anemia Panel: Recent Labs  07/14/17 0926  VITAMINB12 181  FOLATE 10.7  FERRITIN 23  TIBC 260  IRON 9*   Sepsis Labs: No results for input(s): PROCALCITON, LATICACIDVEN in the last 168 hours.  Recent Results (from the past 240 hour(s))  Culture, blood (routine x 2) Call MD if unable to obtain prior to antibiotics being given     Status: None (Preliminary result)   Collection Time: 07/13/17 10:29 AM  Result Value Ref Range Status   Specimen Description   Final    BLOOD RIGHT HAND Performed at [facility], [address], [place], [place] [address]    Special Requests   Final    IN PEDIATRIC BOTTLE Blood Culture results may not be optimal due to an excessive volume of blood received in culture bottles Performed at [facility], [address] [place], [place] [address]    Culture   Final    NO GROWTH 1 DAY Performed at [facility], [address], [place], [place] [address]    Report Status PENDING  Incomplete  Culture, blood (routine x 2) Call MD if unable to obtain prior to antibiotics being given     Status: None (Preliminary result)   Collection Time: 07/13/17 10:30 AM  Result Value Ref Range Status   Specimen Description   Final    BLOOD LEFT ARM Performed at [facility], [address], [place], [place] [address]    Special Requests   Final    BOTTLES DRAWN AEROBIC AND ANAEROBIC Blood Culture adequate volume Performed at [facility], [address], [place], [place] [address]    Culture    Final    NO GROWTH 1 DAY Performed at [facility], [address] [place], [place] [address]    Report Status PENDING  Incomplete         Radiology Studies: No results found.      Scheduled Meds: . azithromycin  1,200 mg Oral Weekly  . enoxaparin (LOVENOX) injection  40 mg Subcutaneous Q24H  . nicotine  7 mg Transdermal Daily  . predniSONE  40 mg Oral BID WC   Followed by  . [START ON 07/20/2017] predniSONE  40 mg Oral Q breakfast   Followed by  . [START ON 07/25/2017] predniSONE  20 mg Oral Q breakfast  . sodium chloride flush  3 mL Intravenous Q12H   Continuous Infusions: . sodium chloride 250 mL (07/14/17 0551)  . sulfamethoxazole-trimethoprim 320 mg (07/15/17 0417)     LOS: 2 days    Time spent: over 20 min    Lacretia Nicks, MD [facility] Pager [phone]  If 7PM-7AM, please contact night-coverage [email or web address] Password [email or web address] 07/15/2017, 10:12 AM

## 2017-07-16 DIAGNOSIS — B20 Human immunodeficiency virus [HIV] disease: Secondary | ICD-10-CM

## 2017-07-16 LAB — BASIC METABOLIC PANEL
Anion gap: 7 (ref 5–15)
BUN: 8 mg/dL (ref 6–20)
CO2: 25 mmol/L (ref 22–32)
CREATININE: 0.61 mg/dL (ref 0.44–1.00)
Calcium: 8.5 mg/dL — ABNORMAL LOW (ref 8.9–10.3)
Chloride: 105 mmol/L (ref 101–111)
GFR calc Af Amer: >60 mL/min (ref >60)
Glucose, Bld: 129 mg/dL — ABNORMAL HIGH (ref 65–99)
Potassium: 4.5 mmol/L (ref 3.5–5.1)
SODIUM: 137 mmol/L (ref 135–145)

## 2017-07-16 LAB — LEGIONELLA PNEUMOPHILA SEROGP 1 UR AG: L. PNEUMOPHILA SEROGP 1 UR AG: NEGATIVE

## 2017-07-16 LAB — CBC
HCT: 27.9 % — ABNORMAL LOW (ref 36.0–46.0)
Hemoglobin: 8.4 g/dL — ABNORMAL LOW (ref 12.0–15.0)
MCH: 21.7 pg — AB (ref 26.0–34.0)
MCHC: 30.1 g/dL (ref 30.0–36.0)
MCV: 72.1 fL — ABNORMAL LOW (ref 78.0–100.0)
PLATELETS: 276 10*3/uL (ref 150–400)
RBC: 3.87 MIL/uL (ref 3.87–5.11)
RDW: 19.2 % — ABNORMAL HIGH (ref 11.5–15.5)
WBC: 6.4 10*3/uL (ref 4.0–10.5)

## 2017-07-16 LAB — MAGNESIUM: MAGNESIUM: 2 mg/dL (ref 1.7–2.4)

## 2017-07-16 MED ORDER — ONDANSETRON HCL 4 MG/2ML IJ SOLN
4.0000 mg | Freq: Three times a day (TID) | INTRAMUSCULAR | Status: DC | PRN
  Administered 2017-07-16: 4 mg via INTRAVENOUS
  Filled 2017-07-16: qty 2

## 2017-07-16 NOTE — Progress Notes (Signed)
INFECTIOUS DISEASE PROGRESS NOTE  ID: Grace Randall is a 47 y.o. female with  Principal Problem:   PNA (pneumonia) Active Problems:   Acute respiratory failure with hypoxia (HCC)   HIV (human immunodeficiency virus infection) (HCC)   Diarrhea   Anxiety   Depression  Subjective: Cont DOE  Abtx:  Anti-infectives (From admission, onward)   Start     Dose/Rate Route Frequency Ordered Stop   07/15/17 1600  bictegravir-emtricitabine-tenofovir AF (BIKTARVY) 50-200-25 MG per tablet 1 tablet     1 tablet Oral Daily 07/15/17 1438     07/15/17 1000  azithromycin (ZITHROMAX) tablet 1,200 mg     1,200 mg Oral Weekly 07/14/17 1348     07/14/17 1600  sulfamethoxazole-trimethoprim (BACTRIM) 320 mg in dextrose 5 % 500 mL IVPB     320 mg 346.7 mL/hr over 90 Minutes Intravenous Every 6 hours 07/14/17 1359     07/14/17 0800  azithromycin (ZITHROMAX) 500 mg in sodium chloride 0.9 % 250 mL IVPB  Status:  Discontinued     500 mg 250 mL/hr over 60 Minutes Intravenous Every 24 hours 07/13/17 0812 07/14/17 1348   07/14/17 0600  cefTRIAXone (ROCEPHIN) 1 g in sodium chloride 0.9 % 100 mL IVPB  Status:  Discontinued     1 g 200 mL/hr over 30 Minutes Intravenous Every 24 hours 07/13/17 0812 07/14/17 1348   07/13/17 0530  cefTRIAXone (ROCEPHIN) 1 g in sodium chloride 0.9 % 100 mL IVPB     1 g 200 mL/hr over 30 Minutes Intravenous  Once 07/13/17 0518 07/13/17 0658   07/13/17 0530  azithromycin (ZITHROMAX) 500 mg in sodium chloride 0.9 % 250 mL IVPB     500 mg 250 mL/hr over 60 Minutes Intravenous  Once 07/13/17 0518 07/13/17 0811      Medications:  Scheduled: . azithromycin  1,200 mg Oral Weekly  . bictegravir-emtricitabine-tenofovir AF  1 tablet Oral Daily  . enoxaparin (LOVENOX) injection  40 mg Subcutaneous Q24H  . ferrous sulfate  325 mg Oral BID WC  . nicotine  7 mg Transdermal Daily  . predniSONE  40 mg Oral BID WC   Followed by  . [START ON 07/20/2017] predniSONE  40 mg Oral Q breakfast   Followed by  . [START ON 07/25/2017] predniSONE  20 mg Oral Q breakfast  . sodium chloride flush  3 mL Intravenous Q12H    Objective: Vital signs in last 24 hours: Temp:  [97.5 F (36.4 C)-98.3 F (36.8 C)] 97.9 F (36.6 C) (03/04 1341) Pulse Rate:  [71-88] 82 (03/04 1343) Resp:  [16-18] 18 (03/04 1341) BP: (90-103)/(54-60) 90/58 (03/04 1343) SpO2:  [94 %-100 %] 94 % (03/04 1341)   General appearance: alert, cooperative, fatigued and no distress Resp: clear to auscultation bilaterally Cardio: regular rate and rhythm GI: normal findings: bowel sounds normal and soft, non-tender Extremities: edema none  Lab Results Recent Labs    07/15/17 0405 07/16/17 0343  WBC 4.1 6.4  HGB 9.1* 8.4*  HCT 30.8* 27.9*  NA 137 137  K 4.3 4.5  CL 103 105  CO2 26 25  BUN 8 8  CREATININE 0.66 0.61   Liver Panel No results for input(s): PROT, ALBUMIN, AST, ALT, ALKPHOS, BILITOT, BILIDIR, IBILI in the last 72 hours. Sedimentation Rate No results for input(s): ESRSEDRATE in the last 72 hours. C-Reactive Protein No results for input(s): CRP in the last 72 hours.  Microbiology: Recent Results (from the past 240 hour(s))  Culture, blood (routine x 2)  Call MD if unable to obtain prior to antibiotics being given     Status: None (Preliminary result)   Collection Time: 07/13/17 10:29 AM  Result Value Ref Range Status   Specimen Description   Final    BLOOD RIGHT HAND Performed at [facility], [address] [place], [place] [address]    Special Requests   Final    IN PEDIATRIC BOTTLE Blood Culture results may not be optimal due to an excessive volume of blood received in culture bottles Performed at [facility], [address], [place], [place] [address]    Culture   Final    NO GROWTH 3 DAYS Performed at [facility], [address], [place], [place] [address]    Report Status PENDING  Incomplete  Culture, blood (routine x 2) Call MD if  unable to obtain prior to antibiotics being given     Status: None (Preliminary result)   Collection Time: 07/13/17 10:30 AM  Result Value Ref Range Status   Specimen Description   Final    BLOOD LEFT ARM Performed at [facility], [address] [place], [place] [address]    Special Requests   Final    BOTTLES DRAWN AEROBIC AND ANAEROBIC Blood Culture adequate volume Performed at [facility], [address], [place], [place] [address]    Culture   Final    NO GROWTH 3 DAYS Performed at [facility], [address], [place], [place] [address]    Report Status PENDING  Incomplete    Studies/Results: No results found.   Assessment/Plan: AIDS  Pneumonia, suspected PCP  Total days of antibiotics: 2 bactrim, prednisone biktarvy  No hypoxia noted (on 3l [place]) No further fevers Tolerating HIV rx well.  Will consider change to po bactrim tomorrow.  Watch her DOE and O2 stats, exercise tolerance.  We spoke about making sure we can ger her rx for her in clinic and that she would like to pick up at hospital rather than at her home.  Will loop in our clinic pharm staff         Johny Sax MD, FACP Infectious Diseases (pager) [phone] [email or web address] 07/16/2017, 4:14 PM  LOS: 3 days

## 2017-07-16 NOTE — Care Management Important Message (Signed)
Important Message  Patient Details  Name: Grace Randall MRN: [id number] Date of Birth: 03/24/1971   Medicare Important Message Given:  Yes    Caren MacadamFuller, Yamari Ventola 07/16/2017, 12:20 PMImportant Message  Patient Details  Name: Grace Randall MRN: [id number] Date of Birth: 04/09/1971   Medicare Important Message Given:  Yes    Caren MacadamFuller, Dossie Ocanas 07/16/2017, 12:20 PM

## 2017-07-16 NOTE — Progress Notes (Signed)
Pt coughed and it made her vomit. Stated her stomach didn't feel good, medicated with zofran. She vomited about 100 ml. Pt eating pizza and orange juice for supper,tolerating it well.

## 2017-07-16 NOTE — Progress Notes (Signed)
PROGRESS NOTE    Grace Randall  WUJ:[id number] DOB: June 12, 1970 DOA: 07/13/2017 PCP: System, Pcp Not In   Brief Narrative:  Per HPI Grace Randall is a 47 y.o. female with medical history significant of HIV off medications for over 2 years, anxiety, depression comes in with over a month of coughing that is progressively worsened.  She reports a lot of diarrhea also.  She did not know she was running fever her temperature here is almost 102.  She denies any flulike symptoms.  She denies any abdominal pain.  She has not had any nausea or vomiting.  Patient reports she is been off of her meds because she did not like taking them.  She was previously getting her care in [place] she is currently living in [place].  Patient is very upset that I have discussing her HIV status with her.  She is refusing to see an ID doctor and does not want to establish care with anyone for her HIV.  She says "I just do not want to do with that right now".  Patient is referred for admission for bilateral pneumonia.  She is hypoxic at 85% on room air.  Assessment & Plan:   Principal Problem:   PNA (pneumonia) Active Problems:   Acute respiratory failure with hypoxia (HCC)   HIV (human immunodeficiency virus infection) (HCC)   Diarrhea   Anxiety   Depression  Acute Hypoxic Resp Failure secondary to Likely PCP Pneumonia:  CT scan concerning for PCP pneumonia.   Appreciate ID assistance Bactrim  Prednisone Follow PCP DFA Urine strep negative and legionella pending Negative Flu Wean O2 as tolerated (currently on 3 L [place])    HIV/AIDS: CD4 count 50.  Not on any medications for several years.   Appreciate ID assistance Weekly azithro Start biktarvy Follow HIV genotype, RNA Hepatitis panel negative, RPR (negative), GC/Chlam pending  *Do not discuss diagnosis with anyone else in room.  Pt concerned with privacy.  Discussed that we would not discuss with anyone unless she gave her consent.*  Lightheadedness:  with standing.  Follow orthostatics.  Consider bolus.    Diarrhea: follow stool cx.  Seems like this may be decreasing.    Smoking: encouraged cessation  Patch and gum  Anxiety and depression: seems stable, pt not on any medications  Iron deficiency anemia: Suspect 2/2 menses.  start iron, feraheme.  B12 is borderline, follow MMA, likely component of B12 deficiency.   DVT prophylaxis: lovenox Code Status: full  Family Communication: none at bedside Disposition Plan: pending improvement   Consultants:   ID  Procedures:   none  Antimicrobials:  Anti-infectives (From admission, onward)   Start     Dose/Rate Route Frequency Ordered Stop   07/15/17 1600  bictegravir-emtricitabine-tenofovir AF (BIKTARVY) 50-200-25 MG per tablet 1 tablet     1 tablet Oral Daily 07/15/17 1438     07/15/17 1000  azithromycin (ZITHROMAX) tablet 1,200 mg     1,200 mg Oral Weekly 07/14/17 1348     07/14/17 1600  sulfamethoxazole-trimethoprim (BACTRIM) 320 mg in dextrose 5 % 500 mL IVPB     320 mg 346.7 mL/hr over 90 Minutes Intravenous Every 6 hours 07/14/17 1359     07/14/17 0800  azithromycin (ZITHROMAX) 500 mg in sodium chloride 0.9 % 250 mL IVPB  Status:  Discontinued     500 mg 250 mL/hr over 60 Minutes Intravenous Every 24 hours 07/13/17 0812 07/14/17 1348   07/14/17 0600  cefTRIAXone (ROCEPHIN) 1 g in sodium  chloride 0.9 % 100 mL IVPB  Status:  Discontinued     1 g 200 mL/hr over 30 Minutes Intravenous Every 24 hours 07/13/17 0812 07/14/17 1348   07/13/17 0530  cefTRIAXone (ROCEPHIN) 1 g in sodium chloride 0.9 % 100 mL IVPB     1 g 200 mL/hr over 30 Minutes Intravenous  Once 07/13/17 0518 07/13/17 0658   07/13/17 0530  azithromycin (ZITHROMAX) 500 mg in sodium chloride 0.9 % 250 mL IVPB     500 mg 250 mL/hr over 60 Minutes Intravenous  Once 07/13/17 0518 07/13/17 0811         Subjective: Feels hot.  No other complaints.  Objective: Vitals:   07/15/17 0420 07/15/17 1402 07/15/17  2102 07/16/17 0350  BP: 92/61 (!) 94/51 (!) 95/54 97/60  Pulse: 69 67 88 71  Resp: 14 16 16 18   Temp: 97.9 F (36.6 C) 98.4 F (36.9 C) (!) 97.5 F (36.4 C) 98.3 F (36.8 C)  TempSrc: Oral Oral Oral Oral  SpO2: 97% 98% 100% 96%  Weight:      Height:        Intake/Output Summary (Last 24 hours) at 07/16/2017 1217 Last data filed at 07/16/2017 0353 Gross per 24 hour  Intake 2260.33 ml  Output 1400 ml  Net 860.33 ml   Filed Weights   07/13/17 0925 07/14/17 0428  Weight: 69.9 kg (154 lb) 75.4 kg (166 lb 3.6 oz)    Examination:  General: No acute distress. Cardiovascular: Heart sounds show a regular rate, and rhythm. No gallops or rubs. No murmurs. No JVD. Lungs: Clear to auscultation bilaterally with good air movement. No rales, rhonchi or wheezes. Abdomen: Soft, nontender, nondistended with normal active bowel sounds. No masses. No hepatosplenomegaly. Neurological: Alert and oriented 3. Moves all extremities 4 . Cranial nerves II through XII grossly intact. Skin: Warm and dry. No rashes or lesions. Extremities: No clubbing or cyanosis. No edema.  Psychiatric: Mood and affect are normal. Insight and judgment are appropriate.     Data Reviewed: I have personally reviewed following labs and imaging studies  CBC: Recent Labs  Lab 07/12/17 2045 07/14/17 0343 07/15/17 0405 07/16/17 0343  WBC 6.2 5.1 4.1 6.4  NEUTROABS  --  3.9  --   --   HGB 9.6* 8.4* 9.1* 8.4*  HCT 31.6* 27.1* 30.8* 27.9*  MCV 72.6* 72.1* 73.5* 72.1*  PLT 245 231 272 276   Basic Metabolic Panel: Recent Labs  Lab 07/12/17 2045 07/14/17 0343 07/14/17 0926 07/15/17 0405 07/16/17 0343  NA 135 136  --  137 137  K 3.2* 3.1*  --  4.3 4.5  CL 101 104  --  103 105  CO2 23 25  --  26 25  GLUCOSE 93 193*  --  145* 129*  BUN 10 9  --  8 8  CREATININE 0.80 0.72  --  0.66 0.61  CALCIUM 8.4* 8.1*  --  8.3* 8.5*  MG  --   --  1.6* 2.4 2.0   GFR: Estimated Creatinine Clearance: 91.1 mL/min (by C-G  formula based on SCr of 0.61 mg/dL). Liver Function Tests: Recent Labs  Lab 07/12/17 2045  AST 34  ALT 13*  ALKPHOS 66  BILITOT 0.7  PROT 7.4  ALBUMIN 3.0*   Recent Labs  Lab 07/12/17 2045  LIPASE 26   No results for input(s): AMMONIA in the last 168 hours. Coagulation Profile: No results for input(s): INR, PROTIME in the last 168 hours. Cardiac Enzymes: No  results for input(s): CKTOTAL, CKMB, CKMBINDEX, TROPONINI in the last 168 hours. BNP (last 3 results) No results for input(s): PROBNP in the last 8760 hours. HbA1C: No results for input(s): HGBA1C in the last 72 hours. CBG: No results for input(s): GLUCAP in the last 168 hours. Lipid Profile: No results for input(s): CHOL, HDL, LDLCALC, TRIG, CHOLHDL, LDLDIRECT in the last 72 hours. Thyroid Function Tests: No results for input(s): TSH, T4TOTAL, FREET4, T3FREE, THYROIDAB in the last 72 hours. Anemia Panel: Recent Labs    07/14/17 0926  VITAMINB12 181  FOLATE 10.7  FERRITIN 23  TIBC 260  IRON 9*   Sepsis Labs: No results for input(s): PROCALCITON, LATICACIDVEN in the last 168 hours.  Recent Results (from the past 240 hour(s))  Culture, blood (routine x 2) Call MD if unable to obtain prior to antibiotics being given     Status: None (Preliminary result)   Collection Time: 07/13/17 10:29 AM  Result Value Ref Range Status   Specimen Description   Final    BLOOD RIGHT HAND Performed at [facility], [address] [place], [place] [address]    Special Requests   Final    IN PEDIATRIC BOTTLE Blood Culture results may not be optimal due to an excessive volume of blood received in culture bottles Performed at [facility], [address] [place], [place] [address]    Culture   Final    NO GROWTH 2 DAYS Performed at [facility], [address] [place], [place] [address]    Report Status PENDING  Incomplete  Culture, blood (routine x 2) Call MD if unable to obtain  prior to antibiotics being given     Status: None (Preliminary result)   Collection Time: 07/13/17 10:30 AM  Result Value Ref Range Status   Specimen Description   Final    BLOOD LEFT ARM Performed at [facility], [address] [place], [place] [address]    Special Requests   Final    BOTTLES DRAWN AEROBIC AND ANAEROBIC Blood Culture adequate volume Performed at [facility], [address] [place], [place] [address]    Culture   Final    NO GROWTH 2 DAYS Performed at [facility], [address] [place], [place] [address]    Report Status PENDING  Incomplete         Radiology Studies: No results found.      Scheduled Meds: . azithromycin  1,200 mg Oral Weekly  . bictegravir-emtricitabine-tenofovir AF  1 tablet Oral Daily  . enoxaparin (LOVENOX) injection  40 mg Subcutaneous Q24H  . ferrous sulfate  325 mg Oral BID WC  . hepatitis B vac recombinant for neonates/pediatrics  1 mL Intramuscular Once  . nicotine  7 mg Transdermal Daily  . predniSONE  40 mg Oral BID WC   Followed by  . [START ON 07/20/2017] predniSONE  40 mg Oral Q breakfast   Followed by  . [START ON 07/25/2017] predniSONE  20 mg Oral Q breakfast  . sodium chloride flush  3 mL Intravenous Q12H   Continuous Infusions: . sodium chloride 250 mL (07/14/17 0551)  . sulfamethoxazole-trimethoprim Stopped (07/16/17 10270619)     LOS: 3 days    Time spent: over 20 min    Lacretia Nicksaldwell Powell, MD [facility] Pager [phone]  If 7PM-7AM, please contact night-coverage [email or web address] Password [email or web address] 07/16/2017, 12:17 PM

## 2017-07-17 LAB — BASIC METABOLIC PANEL
ANION GAP: 8 (ref 5–15)
BUN: 8 mg/dL (ref 6–20)
CHLORIDE: 103 mmol/L (ref 101–111)
CO2: 25 mmol/L (ref 22–32)
Calcium: 9 mg/dL (ref 8.9–10.3)
Creatinine, Ser: 0.66 mg/dL (ref 0.44–1.00)
GFR calc Af Amer: >60 mL/min (ref >60)
GLUCOSE: 125 mg/dL — AB (ref 65–99)
POTASSIUM: 5 mmol/L (ref 3.5–5.1)
SODIUM: 136 mmol/L (ref 135–145)

## 2017-07-17 LAB — CBC
HCT: 29.8 % — ABNORMAL LOW (ref 36.0–46.0)
HEMOGLOBIN: 9.1 g/dL — AB (ref 12.0–15.0)
MCH: 21.9 pg — ABNORMAL LOW (ref 26.0–34.0)
MCHC: 30.5 g/dL (ref 30.0–36.0)
MCV: 71.8 fL — AB (ref 78.0–100.0)
PLATELETS: 320 10*3/uL (ref 150–400)
RBC: 4.15 MIL/uL (ref 3.87–5.11)
RDW: 19.1 % — ABNORMAL HIGH (ref 11.5–15.5)
WBC: 10 10*3/uL (ref 4.0–10.5)

## 2017-07-17 MED ORDER — SULFAMETHOXAZOLE-TRIMETHOPRIM 800-160 MG PO TABS
2.0000 | ORAL_TABLET | Freq: Three times a day (TID) | ORAL | Status: DC
  Administered 2017-07-17 – 2017-07-19 (×5): 2 via ORAL
  Filled 2017-07-17 (×5): qty 2

## 2017-07-17 MED ORDER — SENNOSIDES-DOCUSATE SODIUM 8.6-50 MG PO TABS
1.0000 | ORAL_TABLET | Freq: Every day | ORAL | Status: DC
  Administered 2017-07-17 – 2017-07-18 (×2): 1 via ORAL
  Filled 2017-07-17 (×2): qty 1

## 2017-07-17 NOTE — Progress Notes (Signed)
Pharmacy Antibiotic Note  Grace Randall is a 47 y.o. female admitted on 07/13/2017 with PCP.  Pharmacy has been consulted for Bactrim IV dosing in AIDS patient  Plan: D3 Bactrim 1) Continue Bactrim IV 15-20mg /kg IV in 3 to 4 divided doses 2) Monitor CBC, SCr, K closely  Height: 5\' 6"  (167.6 cm) Weight: 161 lb 6.4 oz (73.2 kg) IBW/kg (Calculated) : 59.3  Temp (24hrs), Avg:98 F (36.7 C), Min:97.8 F (36.6 C), Max:98.2 F (36.8 C)  Recent Labs  Lab 07/12/17 2045 07/14/17 0343 07/15/17 0405 07/16/17 0343 07/17/17 0416  WBC 6.2 5.1 4.1 6.4 10.0  CREATININE 0.80 0.72 0.66 0.61 0.66    Estimated Creatinine Clearance: 90 mL/min (by C-G formula based on SCr of 0.66 mg/dL).    Allergies  Allergen Reactions  . Other Anaphylaxis and Other (See Comments)    No Seafood      Thank you for allowing pharmacy to be a part of this patient's care.   Hessie KnowsJustin M [place] Mcphee, PharmD, BCPS Pager [phone] 07/17/2017 8:29 AM

## 2017-07-17 NOTE — Progress Notes (Signed)
INFECTIOUS DISEASE PROGRESS NOTE  ID: Grace Randall is a 47 y.o. female with  Principal Problem:   PNA (pneumonia) Active Problems:   Acute respiratory failure with hypoxia (HCC)   HIV (human immunodeficiency virus infection) (HCC)   Diarrhea   Anxiety   Depression   AIDS (HCC)  Subjective: Doing well- SOB back to baseline, no further diarrhea.   Abtx:  Anti-infectives (From admission, onward)   Start     Dose/Rate Route Frequency Ordered Stop   07/15/17 1600  bictegravir-emtricitabine-tenofovir AF (BIKTARVY) 50-200-25 MG per tablet 1 tablet     1 tablet Oral Daily 07/15/17 1438     07/15/17 1000  azithromycin (ZITHROMAX) tablet 1,200 mg     1,200 mg Oral Weekly 07/14/17 1348     07/14/17 1600  sulfamethoxazole-trimethoprim (BACTRIM) 320 mg in dextrose 5 % 500 mL IVPB     320 mg 346.7 mL/hr over 90 Minutes Intravenous Every 6 hours 07/14/17 1359     07/14/17 0800  azithromycin (ZITHROMAX) 500 mg in sodium chloride 0.9 % 250 mL IVPB  Status:  Discontinued     500 mg 250 mL/hr over 60 Minutes Intravenous Every 24 hours 07/13/17 0812 07/14/17 1348   07/14/17 0600  cefTRIAXone (ROCEPHIN) 1 g in sodium chloride 0.9 % 100 mL IVPB  Status:  Discontinued     1 g 200 mL/hr over 30 Minutes Intravenous Every 24 hours 07/13/17 0812 07/14/17 1348   07/13/17 0530  cefTRIAXone (ROCEPHIN) 1 g in sodium chloride 0.9 % 100 mL IVPB     1 g 200 mL/hr over 30 Minutes Intravenous  Once 07/13/17 0518 07/13/17 0658   07/13/17 0530  azithromycin (ZITHROMAX) 500 mg in sodium chloride 0.9 % 250 mL IVPB     500 mg 250 mL/hr over 60 Minutes Intravenous  Once 07/13/17 0518 07/13/17 0811      Medications:  Scheduled: . azithromycin  1,200 mg Oral Weekly  . bictegravir-emtricitabine-tenofovir AF  1 tablet Oral Daily  . enoxaparin (LOVENOX) injection  40 mg Subcutaneous Q24H  . ferrous sulfate  325 mg Oral BID WC  . nicotine  7 mg Transdermal Daily  . predniSONE  40 mg Oral BID WC   Followed by  .  [START ON 07/20/2017] predniSONE  40 mg Oral Q breakfast   Followed by  . [START ON 07/25/2017] predniSONE  20 mg Oral Q breakfast  . sodium chloride flush  3 mL Intravenous Q12H    Objective: Vital signs in last 24 hours: Temp:  [97.8 F (36.6 C)-98.2 F (36.8 C)] 97.8 F (36.6 C) (03/05 1355) Pulse Rate:  [65-70] 70 (03/05 1255) Resp:  [16-17] 16 (03/05 1255) BP: (96-97)/(64-67) 97/65 (03/05 1255) SpO2:  [95 %-99 %] 99 % (03/05 1355) Weight:  [73.2 kg (161 lb 6.4 oz)] 73.2 kg (161 lb 6.4 oz) (03/04 2154)   General appearance: alert, cooperative and no distress Resp: clear to auscultation bilaterally Cardio: regular rate and rhythm GI: normal findings: bowel sounds normal and soft, non-tender  Lab Results Recent Labs    07/16/17 0343 07/17/17 0416  WBC 6.4 10.0  HGB 8.4* 9.1*  HCT 27.9* 29.8*  NA 137 136  K 4.5 5.0  CL 105 103  CO2 25 25  BUN 8 8  CREATININE 0.61 0.66   Liver Panel No results for input(s): PROT, ALBUMIN, AST, ALT, ALKPHOS, BILITOT, BILIDIR, IBILI in the last 72 hours. Sedimentation Rate No results for input(s): ESRSEDRATE in the last 72 hours. C-Reactive Protein  No results for input(s): CRP in the last 72 hours.  Microbiology: Recent Results (from the past 240 hour(s))  Culture, blood (routine x 2) Call MD if unable to obtain prior to antibiotics being given     Status: None (Preliminary result)   Collection Time: 07/13/17 10:29 AM  Result Value Ref Range Status   Specimen Description   Final    BLOOD RIGHT HAND Performed at [facility], [address] [place], [place] [address]    Special Requests   Final    IN PEDIATRIC BOTTLE Blood Culture results may not be optimal due to an excessive volume of blood received in culture bottles Performed at [facility], [address] [place], [place] [address]    Culture   Final    NO GROWTH 4 DAYS Performed at [facility], [address]  [place], [place] [address]    Report Status PENDING  Incomplete  Culture, blood (routine x 2) Call MD if unable to obtain prior to antibiotics being given     Status: None (Preliminary result)   Collection Time: 07/13/17 10:30 AM  Result Value Ref Range Status   Specimen Description   Final    BLOOD LEFT ARM Performed at [facility], [address] [place], [place] [address]    Special Requests   Final    BOTTLES DRAWN AEROBIC AND ANAEROBIC Blood Culture adequate volume Performed at [facility], [address] [place], [place] [address]    Culture   Final    NO GROWTH 4 DAYS Performed at [facility], [address] [place], [place] [address]    Report Status PENDING  Incomplete    Studies/Results: No results found.   Assessment/Plan: AIDS  Pneumonia, suspected PCP diarrhea  Total days of antibiotics: 3 bactrim, prednisone biktarvy  Will change her bactrim to po (she is now on RA) Her prednisone taper is written Will have her f/u with me in ID clinic in next 2 weeks.  Possible d/c in Am if she continues to improve.  Diarrhea appears to be resolved.          Johny SaxJeffrey Talisa Petrak MD, FACP Infectious Diseases (pager) [phone] [email or web address] 07/17/2017, 5:05 PM  LOS: 4 days

## 2017-07-17 NOTE — Progress Notes (Signed)
PROGRESS NOTE    Grace Randall  ZOX:[id number] DOB: [birth year] DOA: 07/13/2017 PCP: System, Pcp Not In   Brief Narrative:  Per HPI Grace Randall is a 47 y.o. female with medical history significant of HIV off medications for over 2 years, anxiety, depression comes in with over a month of coughing that is progressively worsened.  She reports a lot of diarrhea also.  She did not know she was running fever her temperature here is almost 102.  She denies any flulike symptoms.  She denies any abdominal pain.  She has not had any nausea or vomiting.  Patient reports she is been off of her meds because she did not like taking them.  She was previously getting her care in [place] she is currently living in [place].  Patient is very upset that I have discussing her HIV status with her.  She is refusing to see an ID doctor and does not want to establish care with anyone for her HIV.  She says "I just do not want to do with that right now".  Patient is referred for admission for bilateral pneumonia.  She is hypoxic at 85% on room air.  Assessment & Plan:   Principal Problem:   PNA (pneumonia) Active Problems:   Acute respiratory failure with hypoxia (HCC)   HIV (human immunodeficiency virus infection) (HCC)   Diarrhea   Anxiety   Depression   AIDS (HCC)  Acute Hypoxic Resp Failure secondary to Likely PCP Pneumonia:  CT scan concerning for PCP pneumonia.   Appreciate ID assistance Bactrim  Prednisone Follow PCP DFA Urine strep negative and legionella pending Negative Flu Wean O2 as tolerated     HIV/AIDS: CD4 count 50.  Not on any medications for several years.   Appreciate ID assistance Weekly azithro Start biktarvy Follow HIV genotype pending, RNA pending Hepatitis panel negative, RPR (negative), GC/Chlam pending collection  *Do not discuss diagnosis with anyone else in room.  Pt concerned with privacy.  Discussed that we would not discuss with anyone unless she gave her  consent.*  Lightheadedness: with standing.  Follow orthostatics (normal).  Follow.    Diarrhea: resolved.  Follow stool cx (pending collection).    Smoking: encouraged cessation  Patch and gum  Anxiety and depression: seems stable, pt not on any medications  Iron deficiency anemia: Suspect 2/2 menses.  start iron, feraheme.  B12 is borderline, follow MMA, likely component of B12 deficiency.   DVT prophylaxis: lovenox Code Status: full  Family Communication: none at bedside Disposition Plan: pending improvement   Consultants:   ID  Procedures:   none  Antimicrobials:  Anti-infectives (From admission, onward)   Start     Dose/Rate Route Frequency Ordered Stop   07/17/17 1715  sulfamethoxazole-trimethoprim (BACTRIM DS,SEPTRA DS) 800-160 MG per tablet 2 tablet     2 tablet Oral Every 8 hours 07/17/17 1711     07/15/17 1600  bictegravir-emtricitabine-tenofovir AF (BIKTARVY) 50-200-25 MG per tablet 1 tablet     1 tablet Oral Daily 07/15/17 1438     07/15/17 1000  azithromycin (ZITHROMAX) tablet 1,200 mg     1,200 mg Oral Weekly 07/14/17 1348     07/14/17 1600  sulfamethoxazole-trimethoprim (BACTRIM) 320 mg in dextrose 5 % 500 mL IVPB  Status:  Discontinued     320 mg 346.7 mL/hr over 90 Minutes Intravenous Every 6 hours 07/14/17 1359 07/17/17 1711   07/14/17 0800  azithromycin (ZITHROMAX) 500 mg in sodium chloride 0.9 % 250 mL IVPB  Status:  Discontinued     500 mg 250 mL/hr over 60 Minutes Intravenous Every 24 hours 07/13/17 0812 07/14/17 1348   07/14/17 0600  cefTRIAXone (ROCEPHIN) 1 g in sodium chloride 0.9 % 100 mL IVPB  Status:  Discontinued     1 g 200 mL/hr over 30 Minutes Intravenous Every 24 hours 07/13/17 0812 07/14/17 1348   07/13/17 0530  cefTRIAXone (ROCEPHIN) 1 g in sodium chloride 0.9 % 100 mL IVPB     1 g 200 mL/hr over 30 Minutes Intravenous  Once 07/13/17 0518 07/13/17 0658   07/13/17 0530  azithromycin (ZITHROMAX) 500 mg in sodium chloride 0.9 % 250 mL  IVPB     500 mg 250 mL/hr over 60 Minutes Intravenous  Once 07/13/17 0518 07/13/17 0811         Subjective: No complaints.  Feeling better.   Objective: Vitals:   07/17/17 0619 07/17/17 1255 07/17/17 1300 07/17/17 1355  BP: 96/67 97/65    Pulse: 65 70    Resp: 16 16    Temp: 97.8 F (36.6 C)   97.8 F (36.6 C)  TempSrc: Oral   Oral  SpO2: 95% 97% 99% 99%  Weight:      Height:        Intake/Output Summary (Last 24 hours) at 07/17/2017 1718 Last data filed at 07/17/2017 1255 Gross per 24 hour  Intake 2140 ml  Output 250 ml  Net 1890 ml   Filed Weights   07/13/17 0925 07/14/17 0428 07/16/17 2154  Weight: 69.9 kg (154 lb) 75.4 kg (166 lb 3.6 oz) 73.2 kg (161 lb 6.4 oz)    Examination:  General: No acute distress. Cardiovascular: Heart sounds show a regular rate, and rhythm. No gallops or rubs. No murmurs. No JVD. Lungs: Clear to auscultation bilaterally with good air movement. No rales, rhonchi or wheezes. Abdomen: Soft, nontender, nondistended with normal active bowel sounds. No masses. No hepatosplenomegaly. Neurological: Alert and oriented 3. Moves all extremities 4. Cranial nerves II through XII grossly intact. Skin: Warm and dry. No rashes or lesions. Extremities: No clubbing or cyanosis. No edema.  Psychiatric: Mood and affect are normal. Insight and judgment are appropriate.    Data Reviewed: I have personally reviewed following labs and imaging studies  CBC: Recent Labs  Lab 07/12/17 2045 07/14/17 0343 07/15/17 0405 07/16/17 0343 07/17/17 0416  WBC 6.2 5.1 4.1 6.4 10.0  NEUTROABS  --  3.9  --   --   --   HGB 9.6* 8.4* 9.1* 8.4* 9.1*  HCT 31.6* 27.1* 30.8* 27.9* 29.8*  MCV 72.6* 72.1* 73.5* 72.1* 71.8*  PLT 245 231 272 276 320   Basic Metabolic Panel: Recent Labs  Lab 07/12/17 2045 07/14/17 0343 07/14/17 0926 07/15/17 0405 07/16/17 0343 07/17/17 0416  NA 135 136  --  137 137 136  K 3.2* 3.1*  --  4.3 4.5 5.0  CL 101 104  --  103 105  103  CO2 23 25  --  26 25 25   GLUCOSE 93 193*  --  145* 129* 125*  BUN 10 9  --  8 8 8   CREATININE 0.80 0.72  --  0.66 0.61 0.66  CALCIUM 8.4* 8.1*  --  8.3* 8.5* 9.0  MG  --   --  1.6* 2.4 2.0  --    GFR: Estimated Creatinine Clearance: 90 mL/min (by C-G formula based on SCr of 0.66 mg/dL). Liver Function Tests: Recent Labs  Lab 07/12/17 2045  AST 34  ALT 13*  ALKPHOS 66  BILITOT 0.7  PROT 7.4  ALBUMIN 3.0*   Recent Labs  Lab 07/12/17 2045  LIPASE 26   No results for input(s): AMMONIA in the last 168 hours. Coagulation Profile: No results for input(s): INR, PROTIME in the last 168 hours. Cardiac Enzymes: No results for input(s): CKTOTAL, CKMB, CKMBINDEX, TROPONINI in the last 168 hours. BNP (last 3 results) No results for input(s): PROBNP in the last 8760 hours. HbA1C: No results for input(s): HGBA1C in the last 72 hours. CBG: No results for input(s): GLUCAP in the last 168 hours. Lipid Profile: No results for input(s): CHOL, HDL, LDLCALC, TRIG, CHOLHDL, LDLDIRECT in the last 72 hours. Thyroid Function Tests: No results for input(s): TSH, T4TOTAL, FREET4, T3FREE, THYROIDAB in the last 72 hours. Anemia Panel: No results for input(s): VITAMINB12, FOLATE, FERRITIN, TIBC, IRON, RETICCTPCT in the last 72 hours. Sepsis Labs: No results for input(s): PROCALCITON, LATICACIDVEN in the last 168 hours.  Recent Results (from the past 240 hour(s))  Culture, blood (routine x 2) Call MD if unable to obtain prior to antibiotics being given     Status: None (Preliminary result)   Collection Time: 07/13/17 10:29 AM  Result Value Ref Range Status   Specimen Description   Final    BLOOD RIGHT HAND Performed at [facility], [address], [place], [place] [address]    Special Requests   Final    IN PEDIATRIC BOTTLE Blood Culture results may not be optimal due to an excessive volume of blood received in culture bottles Performed at [facility], [address], [place], [place] [address]    Culture   Final    NO GROWTH 4 DAYS Performed at [facility], [address] [place], [place] [address]    Report Status PENDING  Incomplete  Culture, blood (routine x 2) Call MD if unable to obtain prior to antibiotics being given     Status: None (Preliminary result)   Collection Time: 07/13/17 10:30 AM  Result Value Ref Range Status   Specimen Description   Final    BLOOD LEFT ARM Performed at [facility], [address], [place], [place] [address]    Special Requests   Final    BOTTLES DRAWN AEROBIC AND ANAEROBIC Blood Culture adequate volume Performed at [facility], [address], [place], [place] [address]    Culture   Final    NO GROWTH 4 DAYS Performed at [facility], [address] [place], [place] [address]    Report Status PENDING  Incomplete         Radiology Studies: No results found.      Scheduled Meds: . azithromycin  1,200 mg Oral Weekly  . bictegravir-emtricitabine-tenofovir AF  1 tablet Oral Daily  . enoxaparin (LOVENOX) injection  40 mg Subcutaneous Q24H  . ferrous sulfate  325 mg Oral BID WC  . nicotine  7 mg Transdermal Daily  . predniSONE  40 mg Oral BID WC   Followed by  . [START ON 07/20/2017] predniSONE  40 mg Oral Q breakfast   Followed by  . [START ON 07/25/2017] predniSONE  20 mg Oral Q breakfast  . sodium chloride flush  3 mL Intravenous Q12H  . sulfamethoxazole-trimethoprim  2 tablet Oral Q8H   Continuous Infusions: . sodium chloride 250 mL (07/14/17 0551)     LOS: 4 days    Time spent: over 20 min  Lacretia Nicks, MD [facility] Pager [phone]  If 7PM-7AM, please contact night-coverage [email or web address] Password [email or web address] 07/17/2017, 5:18 PM

## 2017-07-17 NOTE — Progress Notes (Signed)
Pharmacy Antibiotic Note  Grace RightJamie Randall is a 47 y.o. female admitted on 07/13/2017 with PCP.  Pharmacy has been consulted for Bactrim IV dosing in AIDS patient  Plan: D3 Bactrim Change to Septra DS 2 tablets TID  Height: 5\' 6"  (167.6 cm) Weight: 161 lb 6.4 oz (73.2 kg) IBW/kg (Calculated) : 59.3  Temp (24hrs), Avg:97.9 F (36.6 C), Min:97.8 F (36.6 C), Max:98.2 F (36.8 C)  Recent Labs  Lab 07/12/17 2045 07/14/17 0343 07/15/17 0405 07/16/17 0343 07/17/17 0416  WBC 6.2 5.1 4.1 6.4 10.0  CREATININE 0.80 0.72 0.66 0.61 0.66    Estimated Creatinine Clearance: 90 mL/min (by C-G formula based on SCr of 0.66 mg/dL).    Allergies  Allergen Reactions  . Other Anaphylaxis and Other (See Comments)    No Seafood      Thank you for allowing pharmacy to be a part of this patient's care.  Herby AbrahamMichelle T. Avel Ogawa, Pharm.D. [phone] 07/17/2017 5:36 PM

## 2017-07-18 ENCOUNTER — Telehealth: Payer: Self-pay | Admitting: Behavioral Health

## 2017-07-18 ENCOUNTER — Inpatient Hospital Stay (HOSPITAL_COMMUNITY): Payer: Medicare Other

## 2017-07-18 ENCOUNTER — Telehealth: Payer: Self-pay

## 2017-07-18 DIAGNOSIS — K5900 Constipation, unspecified: Secondary | ICD-10-CM

## 2017-07-18 DIAGNOSIS — R42 Dizziness and giddiness: Secondary | ICD-10-CM

## 2017-07-18 DIAGNOSIS — F3112 Bipolar disorder, current episode manic without psychotic features, moderate: Secondary | ICD-10-CM

## 2017-07-18 DIAGNOSIS — R51 Headache: Secondary | ICD-10-CM

## 2017-07-18 DIAGNOSIS — F329 Major depressive disorder, single episode, unspecified: Secondary | ICD-10-CM

## 2017-07-18 LAB — CULTURE, BLOOD (ROUTINE X 2)
CULTURE: NO GROWTH
Culture: NO GROWTH
Special Requests: ADEQUATE

## 2017-07-18 LAB — HLA B*5701: HLA B 5701: NEGATIVE

## 2017-07-18 LAB — METHYLMALONIC ACID, SERUM: METHYLMALONIC ACID, QUANTITATIVE: 306 nmol/L (ref 0–378)

## 2017-07-18 MED ORDER — VITAMIN B-12 1000 MCG PO TABS
1000.0000 ug | ORAL_TABLET | Freq: Every day | ORAL | Status: DC
  Administered 2017-07-18 – 2017-07-19 (×2): 1000 ug via ORAL
  Filled 2017-07-18 (×2): qty 1

## 2017-07-18 MED ORDER — MECLIZINE HCL 25 MG PO TABS
12.5000 mg | ORAL_TABLET | Freq: Three times a day (TID) | ORAL | Status: DC
  Administered 2017-07-18 – 2017-07-19 (×3): 12.5 mg via ORAL
  Filled 2017-07-18 (×3): qty 1

## 2017-07-18 MED ORDER — GADOBENATE DIMEGLUMINE 529 MG/ML IV SOLN
15.0000 mL | Freq: Once | INTRAVENOUS | Status: AC | PRN
  Administered 2017-07-18: 15 mL via INTRAVENOUS

## 2017-07-18 NOTE — Progress Notes (Addendum)
INFECTIOUS DISEASE PROGRESS NOTE  ID: Grace Randall is a 47 y.o. female with  Principal Problem:   PNA (pneumonia) Active Problems:   Acute respiratory failure with hypoxia (HCC)   HIV (human immunodeficiency virus infection) (HCC)   Diarrhea   Anxiety   Depression   AIDS (HCC)  Subjective: Pt upset about disclosure of her information to DIS.  +constipation +headaches (chronic) New vertigo, dizziness.   Abtx:  Anti-infectives (From admission, onward)   Start     Dose/Rate Route Frequency Ordered Stop   07/17/17 2200  sulfamethoxazole-trimethoprim (BACTRIM DS,SEPTRA DS) 800-160 MG per tablet 2 tablet     2 tablet Oral Every 8 hours 07/17/17 1711     07/15/17 1600  bictegravir-emtricitabine-tenofovir AF (BIKTARVY) 50-200-25 MG per tablet 1 tablet     1 tablet Oral Daily 07/15/17 1438     07/15/17 1000  azithromycin (ZITHROMAX) tablet 1,200 mg     1,200 mg Oral Weekly 07/14/17 1348     07/14/17 1600  sulfamethoxazole-trimethoprim (BACTRIM) 320 mg in dextrose 5 % 500 mL IVPB  Status:  Discontinued     320 mg 346.7 mL/hr over 90 Minutes Intravenous Every 6 hours 07/14/17 1359 07/17/17 1711   07/14/17 0800  azithromycin (ZITHROMAX) 500 mg in sodium chloride 0.9 % 250 mL IVPB  Status:  Discontinued     500 mg 250 mL/hr over 60 Minutes Intravenous Every 24 hours 07/13/17 0812 07/14/17 1348   07/14/17 0600  cefTRIAXone (ROCEPHIN) 1 g in sodium chloride 0.9 % 100 mL IVPB  Status:  Discontinued     1 g 200 mL/hr over 30 Minutes Intravenous Every 24 hours 07/13/17 0812 07/14/17 1348   07/13/17 0530  cefTRIAXone (ROCEPHIN) 1 g in sodium chloride 0.9 % 100 mL IVPB     1 g 200 mL/hr over 30 Minutes Intravenous  Once 07/13/17 0518 07/13/17 0658   07/13/17 0530  azithromycin (ZITHROMAX) 500 mg in sodium chloride 0.9 % 250 mL IVPB     500 mg 250 mL/hr over 60 Minutes Intravenous  Once 07/13/17 0518 07/13/17 0811      Medications:  Scheduled: . azithromycin  1,200 mg Oral Weekly  .  bictegravir-emtricitabine-tenofovir AF  1 tablet Oral Daily  . enoxaparin (LOVENOX) injection  40 mg Subcutaneous Q24H  . ferrous sulfate  325 mg Oral BID WC  . meclizine  12.5 mg Oral TID  . nicotine  7 mg Transdermal Daily  . predniSONE  40 mg Oral BID WC   Followed by  . [START ON 07/20/2017] predniSONE  40 mg Oral Q breakfast   Followed by  . [START ON 07/25/2017] predniSONE  20 mg Oral Q breakfast  . senna-docusate  1 tablet Oral QHS  . sodium chloride flush  3 mL Intravenous Q12H  . sulfamethoxazole-trimethoprim  2 tablet Oral Q8H  . vitamin B-12  1,000 mcg Oral Daily    Objective: Vital signs in last 24 hours: Temp:  [97.6 F (36.4 C)-98 F (36.7 C)] 97.7 F (36.5 C) (03/06 1454) Pulse Rate:  [64-69] 64 (03/06 1454) Resp:  [20] 20 (03/06 1454) BP: (98-105)/(61-65) 105/61 (03/06 1454) SpO2:  [92 %-97 %] 97 % (03/06 1454)   General appearance: alert, cooperative and no distress Resp: she speaks for 5 minutes without SOb or cough.   Lab Results Recent Labs    07/16/17 0343 07/17/17 0416  WBC 6.4 10.0  HGB 8.4* 9.1*  HCT 27.9* 29.8*  NA 137 136  K 4.5 5.0  CL  105 103  CO2 25 25  BUN 8 8  CREATININE 0.61 0.66   Liver Panel No results for input(s): PROT, ALBUMIN, AST, ALT, ALKPHOS, BILITOT, BILIDIR, IBILI in the last 72 hours. Sedimentation Rate No results for input(s): ESRSEDRATE in the last 72 hours. C-Reactive Protein No results for input(s): CRP in the last 72 hours.  Microbiology: Recent Results (from the past 240 hour(s))  Culture, blood (routine x 2) Call MD if unable to obtain prior to antibiotics being given     Status: None   Collection Time: 07/13/17 10:29 AM  Result Value Ref Range Status   Specimen Description   Final    BLOOD RIGHT HAND Performed at [facility], [address] [place], [place] [address]    Special Requests   Final    IN PEDIATRIC BOTTLE Blood Culture results may not be optimal due to an excessive volume  of blood received in culture bottles Performed at [facility], [address] [place], [place] [address]    Culture   Final    NO GROWTH 5 DAYS Performed at [facility], [address] [place], [place] [address]    Report Status 07/18/2017 FINAL  Final  Culture, blood (routine x 2) Call MD if unable to obtain prior to antibiotics being given     Status: None   Collection Time: 07/13/17 10:30 AM  Result Value Ref Range Status   Specimen Description   Final    BLOOD LEFT ARM Performed at [facility], [address] [place], [place] [address]    Special Requests   Final    BOTTLES DRAWN AEROBIC AND ANAEROBIC Blood Culture adequate volume Performed at [facility], [address] [place], [place] [address]    Culture   Final    NO GROWTH 5 DAYS Performed at [facility], [address] [place], [place] [address]    Report Status 07/18/2017 FINAL  Final    Studies/Results: No results found.   Assessment/Plan: AIDS  Pneumonia, suspected PCP Diarrhea- resolved Manic Depression  Total days of antibiotics:4 bactrim, prednisone biktarvy  I spoke with clinical staff in ID clinic- she called twice and was irate and using obscenities.  She is apologetic for this and promises to call staff and apologize I made it clear that for her to be seen in ID clinic she needs to be respectful to staff as she would expect them to be respectful to her  She agrees to this I will work on her f/u appt in AM Will work on her d/c rx in AM. I asked her to contact patient experience if she is concerned about disclosure. My understanding is that this was state law requiring reporting of new cases and subsequent contact tracing.  Will f/u in AM   I spent a total of 25 minutes with this patient. Greater than 50% of this time was spent counseling and coordinating care for this patient.           Johny SaxJeffrey Aleenah Homen MD,  FACP Infectious Diseases (pager) [phone] [email or web address] 07/18/2017, 6:57 PM  LOS: 5 days

## 2017-07-18 NOTE — Telephone Encounter (Signed)
Patient called upset today stating the health department called her about her HIV status.  Patient states, "You all told them about this and I am fucking pissed."  Writer explained that in the state [place]  HIV results have to be reported and the Health Department and the Health Department will reach to patients.  Patient still feels that the staff at [facility] disclosed this informatio to the Health Department.  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  RN repeated the information to patient again explaining to her that [facility] does not report this information.  Patient stated, "If I find out you all were the ones who called its going to be a Fucking problem.  Patient hung up the phone. Angeline SlimAshley Hill RN

## 2017-07-18 NOTE — Telephone Encounter (Signed)
Patient is currently admitted and calling from her room in the hospital. This is her second time calling this morning she is very upset and cursing.  She wants to know who told DIS about her HIV diagnosis because she told Dr Ninetta LightsHatcher not to share any information .  She has requested Dr Ninetta LightsHatcher come [facility].   She does not want to speak with anyone else and does not want her HIV information shared.    In a previous conversation this morning I explained the process of HIV referral through the state and it being separate from [facility]. She insist she will sue [facility] if she finds out we disclosed her HIV status and hung up the phone.   Laurell Josephsammy K Blessin Kanno, RN    Dr Ninetta LightsHatcher was informed of this call and will see the patient later today at the hospital.   Laurell Josephsammy K Yordy Matton, RN

## 2017-07-18 NOTE — Progress Notes (Signed)
[facility] PROGRESS NOTE  Barbra Miner ZOX:[id number] DOB: [birth year] DOA: 07/13/2017  PCP: System, Pcp Not In  Brief History/Interval Summary: Grace Randall a 47 y.o.femalewith medical history significant ofHIV off medications for over 2 years, anxiety, depression comes in with over a month of coughing that is progressively worsened. Patient reports she is been off of her meds because she did not like taking them. She was previously getting her care in [place] she is currently living in [place]. Patient noted to get very upset whenever her HIV status was brought up with her.  Was noted to be hypoxic.  She was noted to have bilateral pneumonia with concerns for PCP.  She was hospitalized.  She was seen by infectious disease.  Reason for Visit: PCP pneumonia  Consultants: Infectious disease  Procedures: None  Antibiotics: Bactrim with prednisone  Subjective/Interval History: Patient mentions that she gets very dizzy and lightheaded whenever she would walk.  She had to be escorted back to room when she was walking the hallway earlier today.  She describes as if the room was spinning around her.  Patient is a very poor historian.  She would get very upset in the middle of conversation and would stop talking suddenly.  Also mentions history of headaches.  ROS: Denies any chest pain.  Shortness of breath is improved.  Objective:  Vital Signs  Vitals:   07/17/17 1300 07/17/17 1355 07/17/17 2219 07/18/17 0605  BP:   98/63 99/65  Pulse:   66 69  Resp:   20 20  Temp:  97.8 F (36.6 C) 97.6 F (36.4 C) 98 F (36.7 C)  TempSrc:  Oral Oral Oral  SpO2: 99% 99% 97% 92%  Weight:      Height:        Intake/Output Summary (Last 24 hours) at 07/18/2017 1432 Last data filed at 07/18/2017 0606 Gross per 24 hour  Intake 600 ml  Output 900 ml  Net -300 ml   Filed Weights   07/13/17 0925 07/14/17 0428 07/16/17 2154  Weight: 69.9 kg (154 lb) 75.4 kg (166 lb 3.6 oz) 73.2  kg (161 lb 6.4 oz)    General appearance: alert, cooperative, appears stated age and no distress Head: Normocephalic, without obvious abnormality, atraumatic Resp: Good air entry bilaterally.  Few crackles at the bases.  No wheezing or rhonchi. Cardio: regular rate and rhythm, S1, S2 normal, no murmur, click, rub or gallop GI: soft, non-tender; bowel sounds normal; no masses,  no organomegaly Extremities: extremities normal, atraumatic, no cyanosis or edema Neurologic: Alert and oriented x3.  Cranial nerves II to XII intact.  No nystagmus.  Cerebellar signs normal.  Finger to nose normal.  No pronator drift.  Lab Results:  Data Reviewed: I have personally reviewed following labs and imaging studies  CBC: Recent Labs  Lab 07/12/17 2045 07/14/17 0343 07/15/17 0405 07/16/17 0343 07/17/17 0416  WBC 6.2 5.1 4.1 6.4 10.0  NEUTROABS  --  3.9  --   --   --   HGB 9.6* 8.4* 9.1* 8.4* 9.1*  HCT 31.6* 27.1* 30.8* 27.9* 29.8*  MCV 72.6* 72.1* 73.5* 72.1* 71.8*  PLT 245 231 272 276 320    Basic Metabolic Panel: Recent Labs  Lab 07/12/17 2045 07/14/17 0343 07/14/17 0926 07/15/17 0405 07/16/17 0343 07/17/17 0416  NA 135 136  --  137 137 136  K 3.2* 3.1*  --  4.3 4.5 5.0  CL 101 104  --  103 105 103  CO2 23  25  --  26 25 25   GLUCOSE 93 193*  --  145* 129* 125*  BUN 10 9  --  8 8 8   CREATININE 0.80 0.72  --  0.66 0.61 0.66  CALCIUM 8.4* 8.1*  --  8.3* 8.5* 9.0  MG  --   --  1.6* 2.4 2.0  --     GFR: Estimated Creatinine Clearance: 90 mL/min (by C-G formula based on SCr of 0.66 mg/dL).  Liver Function Tests: Recent Labs  Lab 07/12/17 2045  AST 34  ALT 13*  ALKPHOS 66  BILITOT 0.7  PROT 7.4  ALBUMIN 3.0*    Recent Labs  Lab 07/12/17 2045  LIPASE 26    Recent Results (from the past 240 hour(s))  Culture, blood (routine x 2) Call MD if unable to obtain prior to antibiotics being given     Status: None (Preliminary result)   Collection Time: 07/13/17 10:29 AM    Result Value Ref Range Status   Specimen Description   Final    BLOOD RIGHT HAND Performed at [facility], [address] [place], [place] [address]    Special Requests   Final    IN PEDIATRIC BOTTLE Blood Culture results may not be optimal due to an excessive volume of blood received in culture bottles Performed at [facility], [address] [place], [place] [address]    Culture   Final    NO GROWTH 4 DAYS Performed at [facility], [address] [place], [place] [address]    Report Status PENDING  Incomplete  Culture, blood (routine x 2) Call MD if unable to obtain prior to antibiotics being given     Status: None (Preliminary result)   Collection Time: 07/13/17 10:30 AM  Result Value Ref Range Status   Specimen Description   Final    BLOOD LEFT ARM Performed at [facility], [address] [place], [place] [address]    Special Requests   Final    BOTTLES DRAWN AEROBIC AND ANAEROBIC Blood Culture adequate volume Performed at [facility], [address] [place], [place] [address]    Culture   Final    NO GROWTH 4 DAYS Performed at [facility], [address] [place], [place] [address]    Report Status PENDING  Incomplete      Radiology Studies: No results found.   Medications:  Scheduled: . azithromycin  1,200 mg Oral Weekly  . bictegravir-emtricitabine-tenofovir AF  1 tablet Oral Daily  . enoxaparin (LOVENOX) injection  40 mg Subcutaneous Q24H  . ferrous sulfate  325 mg Oral BID WC  . nicotine  7 mg Transdermal Daily  . predniSONE  40 mg Oral BID WC   Followed by  . [START ON 07/20/2017] predniSONE  40 mg Oral Q breakfast   Followed by  . [START ON 07/25/2017] predniSONE  20 mg Oral Q breakfast  . senna-docusate  1 tablet Oral QHS  . sodium chloride flush  3 mL Intravenous Q12H  . sulfamethoxazole-trimethoprim  2 tablet Oral Q8H   Continuous: . sodium chloride 250  mL (07/14/17 0551)   VHQ:IONGEXPRN:sodium chloride, acetaminophen, nicotine polacrilex, ondansetron (ZOFRAN) IV, sodium chloride flush  Assessment/Plan:  Principal Problem:   PNA (pneumonia) Active Problems:   Acute respiratory failure with hypoxia (HCC)   HIV (human immunodeficiency virus infection) (HCC)   Diarrhea   Anxiety   Depression   AIDS (HCC)  Acute Hypoxic Resp Failure This was secondary to PCP pneumonia.  Now she is off of oxygen.   PCP Pneumonia CT scan concerning for PCP pneumonia.  Seen by infectious disease.  Started on intravenous Bactrim and prednisone.  Changed to oral Bactrim yesterday.  She has improved clinically.  Dizziness Symptoms somewhat suggestive of vertigo.  Orthostatics were normal.  She does mention ear problems over the last few months.  However she does have a history of AIDS.  Will need to rule out neurological issues.  We will obtain MRI of brain for now.  Trial of meclizine.    HIV/AIDS CD4 count 50.  Not on any medications for several years.  Seen by infectious disease.  Weekly azithromycin.  Patient started on biktarvy. Follow HIV genotype pending, RNA pending. Hepatitis panel negative, RPR (negative), GC/Chlam pending collection.  Outpatient follow-up with infectious disease.  Patient does not want her diagnosis discussed with anyone else.  She is very concerned about privacy.  Diarrhea Resolved.  Smoking Encouraged cessation  Patch and gum  Anxiety and depression Labile mood.    Iron deficiency anemia Suspect 2/2 menses.  start iron, feraheme.  B12 is borderline, follow MMA, likely component of B12 deficiency.   DVT Prophylaxis: Lovenox    Code Status: Full code Family Communication: Discussed with the patient Disposition Plan: Management as outlined above.  Await MRI.  Hopefully discharge tomorrow.    LOS: 5 days   Osvaldo Shipper  [facility] Pager [phone] 07/18/2017, 2:32 PM  If 7PM-7AM, please contact  night-coverage at [email or web address], password [facility]

## 2017-07-19 ENCOUNTER — Other Ambulatory Visit: Payer: Self-pay | Admitting: Pharmacist Clinician (PhC)/ Clinical Pharmacy Specialist

## 2017-07-19 MED ORDER — SULFAMETHOXAZOLE-TRIMETHOPRIM 400-80 MG PO TABS: 1.0000 | ORAL_TABLET | Freq: Every day | ORAL | 1 refills | Status: DC

## 2017-07-19 MED ORDER — CYANOCOBALAMIN 1000 MCG PO TABS: 1000.0000 ug | ORAL_TABLET | Freq: Every day | ORAL | 2 refills | Status: AC

## 2017-07-19 MED ORDER — FERROUS SULFATE 325 (65 FE) MG PO TABS: 325.0000 mg | ORAL_TABLET | Freq: Two times a day (BID) | ORAL | 3 refills | Status: AC

## 2017-07-19 MED ORDER — NICOTINE 14 MG/24HR TD PT24: 14.0000 mg | MEDICATED_PATCH | Freq: Every day | TRANSDERMAL | 0 refills | Status: AC

## 2017-07-19 MED ORDER — AZITHROMYCIN 600 MG PO TABS: 1200.0000 mg | ORAL_TABLET | ORAL | 1 refills | Status: DC

## 2017-07-19 MED ORDER — PREDNISONE 20 MG PO TABS: ORAL_TABLET | ORAL | 0 refills | Status: DC

## 2017-07-19 MED ORDER — PREDNISONE 20 MG PO TABS: 40.0000 mg | ORAL_TABLET | Freq: Every day | ORAL | Status: DC

## 2017-07-19 MED ORDER — POLYETHYLENE GLYCOL 3350 17 G PO PACK: 17.0000 g | PACK | Freq: Every day | ORAL | 0 refills | Status: AC | PRN

## 2017-07-19 MED ORDER — PREDNISONE 20 MG PO TABS: 20.0000 mg | ORAL_TABLET | Freq: Every day | ORAL | Status: DC

## 2017-07-19 MED ORDER — BICTEGRAVIR-EMTRICITAB-TENOFOV 50-200-25 MG PO TABS: 1.0000 | ORAL_TABLET | Freq: Every day | ORAL | 1 refills | Status: DC

## 2017-07-19 MED ORDER — MECLIZINE HCL 12.5 MG PO TABS: ORAL_TABLET | ORAL | 0 refills | Status: DC

## 2017-07-19 MED ORDER — SULFAMETHOXAZOLE-TRIMETHOPRIM 800-160 MG PO TABS: 2.0000 | ORAL_TABLET | Freq: Three times a day (TID) | ORAL | 0 refills | Status: AC

## 2017-07-19 MED FILL — MECLIZINE 25 MG TABLET: 25 | 10 days supply | Qty: 15 | Fill #0

## 2017-07-19 MED FILL — predniSONE 20 MG TABS: 20 | 19 days supply | Qty: 25 | Fill #0

## 2017-07-19 MED FILL — BIKTARVY 50-200-25 MG TABS: 50-200-25 | 30 days supply | Qty: 30 | Fill #0

## 2017-07-19 MED FILL — AZITHROMYCIN 600 MG TABLET: 600 | 14 days supply | Qty: 4 | Fill #0

## 2017-07-19 MED FILL — SULFAMETHOXAZOLE-TMP DS TAB: 800-160 | 16 days supply | Qty: 96 | Fill #0

## 2017-07-19 NOTE — Discharge Instructions (Signed)

## 2017-07-19 NOTE — Discharge Summary (Signed)
[facility]  Physician Discharge Summary   Patient ID: Grace Randall MRN: [id number] DOB/AGE: [birth year] 47 y.o.  Admit date: 07/13/2017 Discharge date: 07/19/2017  PCP: PCP is in [place]  DISCHARGE DIAGNOSES:  Principal Problem:   PNA (pneumonia) Active Problems:   Acute respiratory failure with hypoxia (HCC)   HIV (human immunodeficiency virus infection) (HCC)   Diarrhea   Anxiety   Depression   AIDS (HCC)   Bipolar affective disorder, currently manic, moderate (HCC)   RECOMMENDATIONS FOR OUTPATIENT FOLLOW UP: 1. Patient to follow-up with infectious disease clinic.   DISCHARGE CONDITION: fair  Diet recommendation: As before  Filed Weights   07/13/17 0925 07/14/17 0428 07/16/17 2154  Weight: 69.9 kg (154 lb) 75.4 kg (166 lb 3.6 oz) 73.2 kg (161 lb 6.4 oz)    INITIAL HISTORY: Grace Randall a 47 y.o.femalewith medical history significant ofHIV off medications for over 2 years, anxiety, depression comes in with over a month of coughing that is progressively worsened. Patient reports she is been off of her meds because she did not like taking them. She was previously getting her care in [place] she is currently living in [place]. Patient noted to get very upset whenever her HIV status was brought up with her.  Was noted to be hypoxic.  She was noted to have bilateral pneumonia with concerns for PCP.  She was hospitalized.  She was seen by infectious disease.  Consultations:  Dr. Ninetta Lights with infectious disease  Procedures:  None   HOSPITAL COURSE:   Acute Hypoxic Resp Failure This was secondary to PCP pneumonia.  Now she is off of oxygen.   PCP Pneumonia CT scan concerning for PCP pneumonia.  Seen by infectious disease.  Started on intravenous Bactrim and prednisone.  Changed to oral Bactrim.  She has improved clinically.  Will be discharged on Bactrim to complete course as well as on steroid taper.  Dizziness/Vertigo Symptoms  suggestive of vertigo.  Orthostatics were normal.  She does mention ear problems over the last few months.    However due to her history of HIV/AIDS and headaches MRI was ordered which does not show any acute abnormalities.  Patient notes dizziness has improved with meclizine.  She is able to ambulate without any difficulties.  Patient recommended to follow-up with PCP and consider referral to ENT.      HIV/AIDS CD4 count 50.  Not on any medications for several years.  Seen by infectious disease.  Weekly azithromycin. Patient started on biktarvy.  HIV viral panel genotype etc. pending.  Will be followed up by infectious disease.  Outpatient follow-up with infectious disease.  Patient does not want her diagnosis discussed with anyone else. She is very concerned about privacy.  Diarrhea Resolved.  Smoking Encouraged cessation   Anxiety and depression Stable.  Iron deficiency anemia/vitamin B12 deficiency Suspect 2/2 menses.  Will be discharged on iron supplementation along with B12.  Overall stable.  Ambulating without any difficulties.  Okay for discharge home today.    PERTINENT LABS:  The results of significant diagnostics from this hospitalization (including imaging, microbiology, ancillary and laboratory) are listed below for reference.    Microbiology: Recent Results (from the past 240 hour(s))  Culture, blood (routine x 2) Call MD if unable to obtain prior to antibiotics being given     Status: None   Collection Time: 07/13/17 10:29 AM  Result Value Ref Range Status   Specimen Description   Final    BLOOD RIGHT HAND Performed at  [facility], [address], [place], [place] [address]    Special Requests   Final    IN PEDIATRIC BOTTLE Blood Culture results may not be optimal due to an excessive volume of blood received in culture bottles Performed at [facility], [address], [place], [place] [address]    Culture   Final    NO  GROWTH 5 DAYS Performed at [facility], [address], [place], [place] [address]    Report Status 07/18/2017 FINAL  Final  Culture, blood (routine x 2) Call MD if unable to obtain prior to antibiotics being given     Status: None   Collection Time: 07/13/17 10:30 AM  Result Value Ref Range Status   Specimen Description   Final    BLOOD LEFT ARM Performed at [facility], [address] [place], [place] [address]    Special Requests   Final    BOTTLES DRAWN AEROBIC AND ANAEROBIC Blood Culture adequate volume Performed at [facility], [address], [place], [place] [address]    Culture   Final    NO GROWTH 5 DAYS Performed at [facility], [address], [place], [place] [address]    Report Status 07/18/2017 FINAL  Final     Labs: Basic Metabolic Panel: Recent Labs  Lab 07/12/17 2045 07/14/17 0343 07/14/17 0926 07/15/17 0405 07/16/17 0343 07/17/17 0416  NA 135 136  --  137 137 136  K 3.2* 3.1*  --  4.3 4.5 5.0  CL 101 104  --  103 105 103  CO2 23 25  --  26 25 25   GLUCOSE 93 193*  --  145* 129* 125*  BUN 10 9  --  8 8 8   CREATININE 0.80 0.72  --  0.66 0.61 0.66  CALCIUM 8.4* 8.1*  --  8.3* 8.5* 9.0  MG  --   --  1.6* 2.4 2.0  --    Liver Function Tests: Recent Labs  Lab 07/12/17 2045  AST 34  ALT 13*  ALKPHOS 66  BILITOT 0.7  PROT 7.4  ALBUMIN 3.0*   Recent Labs  Lab 07/12/17 2045  LIPASE 26   CBC: Recent Labs  Lab 07/12/17 2045 07/14/17 0343 07/15/17 0405 07/16/17 0343 07/17/17 0416  WBC 6.2 5.1 4.1 6.4 10.0  NEUTROABS  --  3.9  --   --   --   HGB 9.6* 8.4* 9.1* 8.4* 9.1*  HCT 31.6* 27.1* 30.8* 27.9* 29.8*  MCV 72.6* 72.1* 73.5* 72.1* 71.8*  PLT 245 231 272 276 320    IMAGING STUDIES Dg Chest 2 View  Result Date: 07/12/2017 CLINICAL DATA:  Cough EXAM: CHEST  2 VIEW COMPARISON:  None. FINDINGS: Suggestion of mild perihilar ground-glass opacity. No consolidation or pleural effusion.  Normal heart size. No pneumothorax. IMPRESSION: Possible subtle perihilar ground-glass infiltrates, CT would be more confirmatory. Otherwise negative two-view chest. Electronically Signed   By: Jasmine Pang M.D.   On: 07/12/2017 20:55   Ct Chest W Contrast  Result Date: 07/13/2017 CLINICAL DATA:  47 year old female with cough.  History of HIV. EXAM: CT CHEST WITH CONTRAST TECHNIQUE: Multidetector CT imaging of the chest was performed during intravenous contrast administration. CONTRAST:  75mL ISOVUE-300 IOPAMIDOL (ISOVUE-300) INJECTION 61% COMPARISON:  Chest radiograph dated 07/12/2017 FINDINGS: Cardiovascular: There is no cardiomegaly or pericardial effusion. The thoracic aorta is unremarkable. The origins of the great vessels of the aortic arch are patent.  Evaluation of the pulmonary arteries is limited due to suboptimal enhancement and timing of the contrast. This study is essentially nondiagnostic for evaluation of pulmonary embolism. Mediastinum/Nodes: No hilar adenopathy. Anterior paratracheal lymph node measures 13 mm in short axis. The esophagus is grossly unremarkable. Heterogeneous appearance of the left thyroid gland and probable subcentimeter left thyroid nodule. Ultrasound may provide better evaluation. No mediastinal fluid collection. Lungs/Pleura: There is subpleural emphysema with small cystic changes of the upper lobes. Diffuse bilateral perihilar and upper lobe predominant ground-glass density throughout the lungs concerning for pneumonia, possibly multifocal bacterial infection or related to an opportunistic agent such as pneumocystis pneumonia (PCP) or CMV pneumonia. Non infectious etiology such as lymphocytic interstitial pneumonitis is less likely given rebound upper lobe involvement. Clinical correlation and pulmonary consult is advised. There is no pleural effusion or pneumothorax. The central airways are patent. Upper Abdomen: No acute abnormality. Musculoskeletal: No chest wall  abnormality. No acute or significant osseous findings. IMPRESSION: Diffuse bilateral and upper lobe predominant ground-glass and hazy airspace density concerning for pneumonia. Clinical correlation and pulmonary consult is advised. Electronically Signed   By: Elgie CollardArash  Radparvar M.D.   On: 07/13/2017 07:01   Mr Laqueta JeanBrain W GNWo Contrast  Result Date: 07/18/2017 CLINICAL DATA:  Acute headache. History of HIV/aids. Dizziness. Iron infusion. EXAM: MRI HEAD WITHOUT AND WITH CONTRAST TECHNIQUE: Multiplanar, multiecho pulse sequences of the brain and surrounding structures were obtained without and with intravenous contrast. CONTRAST:  15mL MULTIHANCE GADOBENATE DIMEGLUMINE 529 MG/ML IV SOLN COMPARISON:  None. FINDINGS: Brain: The midline structures are normal. There is no acute infarct or acute hemorrhage. No mass lesion, hydrocephalus, dural abnormality or extra-axial collection. Minimal white matter hyperintensity, nonspecific and often present in asymptomatic patients of this age. No age-advanced or lobar predominant atrophy. No chronic microhemorrhage or superficial siderosis. No abnormal contrast enhancement. Vascular: Major intracranial arterial and venous sinus flow voids are preserved. Skull and upper cervical spine: Generalized low T1-weighted signal of the bone marrow is likely secondary to chronic anemia. Sinuses/Orbits: No fluid levels or advanced mucosal thickening. No mastoid or middle ear effusion. Normal orbits. IMPRESSION: 1. No acute intracranial abnormality. 2. Decreased bone marrow T1-weighted signal is likely due to chronic anemia in the setting of iron infusion therapy. Electronically Signed   By: Deatra RobinsonKevin  Herman M.D.   On: 07/18/2017 21:02    DISCHARGE EXAMINATION: Vitals:   07/18/17 1454 07/18/17 2050 07/19/17 0622 07/19/17 1244  BP: 105/61 (!) 107/57 106/71 105/69  Pulse: 64 64 (!) 58 62  Resp: 20 20 20 18   Temp: 97.7 F (36.5 C) 97.8 F (36.6 C) 98.1 F (36.7 C) 98.2 F (36.8 C)  TempSrc:  Oral Oral Oral Oral  SpO2: 97% 98% 97% 97%  Weight:      Height:       General appearance: alert, cooperative, appears stated age and no distress Resp: clear to auscultation bilaterally Cardio: regular rate and rhythm, S1, S2 normal, no murmur, click, rub or gallop GI: soft, non-tender; bowel sounds normal; no masses,  no organomegaly Extremities: extremities normal, atraumatic, no cyanosis or edema  DISPOSITION: Home  Discharge Instructions    Call MD for:  difficulty breathing, headache or visual disturbances   Complete by:  As directed    Call MD for:  extreme fatigue   Complete by:  As directed    Call MD for:  persistant dizziness or light-headedness   Complete by:  As directed    Call MD for:  persistant nausea and vomiting  Complete by:  As directed    Call MD for:  severe uncontrolled pain   Complete by:  As directed    Call MD for:  temperature >100.4   Complete by:  As directed    Diet general   Complete by:  As directed    Discharge instructions   Complete by:  As directed    Please ask your primary care physician to refer you to ENT for further workup of your right ear pain and vertigo.  Please take your medications as prescribed.  Please do not drive if feeling dizzy or lightheaded.  You were cared for by a hospitalist during your hospital stay. If you have any questions about your discharge medications or the care you received while you were in the hospital after you are discharged, you can call the unit and asked to speak with the hospitalist on call if the hospitalist that took care of you is not available. Once you are discharged, your primary care physician will handle any further medical issues. Please note that NO REFILLS for any discharge medications will be authorized once you are discharged, as it is imperative that you return to your primary care physician (or establish a relationship with a primary care physician if you do not have one) for your aftercare  needs so that they can reassess your need for medications and monitor your lab values. If you do not have a primary care physician, you can call [phone] for a physician referral.   Increase activity slowly   Complete by:  As directed         Allergies as of 07/19/2017      Reactions   Other Anaphylaxis, Other (See Comments)   No Seafood      Medication List    STOP taking these medications   DAYQUIL PO   dextromethorphan-guaiFENesin 30-600 MG 12hr tablet Commonly known as:  MUCINEX DM   NYQUIL PO     TAKE these medications   azithromycin 600 MG tablet Commonly known as:  ZITHROMAX Take 2 tablets (1,200 mg total) by mouth once a week. Start taking on:  07/22/2017   bictegravir-emtricitabine-tenofovir AF 50-200-25 MG Tabs tablet Commonly known as:  BIKTARVY Take 1 tablet by mouth daily.   cyanocobalamin 1000 MCG tablet Take 1 tablet (1,000 mcg total) by mouth daily. Start taking on:  07/20/2017   ferrous sulfate 325 (65 FE) MG tablet Take 1 tablet (325 mg total) by mouth 2 (two) times daily with a meal.   meclizine 12.5 MG tablet Commonly known as:  ANTIVERT Take 3 times daily for 3 days and then as needed for vertigo   nicotine 14 mg/24hr patch Commonly known as:  NICODERM CQ - dosed in mg/24 hours Place 1 patch (14 mg total) onto the skin daily. Start taking on:  07/20/2017   polyethylene glycol packet Commonly known as:  MIRALAX Take 17 g by mouth daily as needed.   predniSONE 20 MG tablet Commonly known as:  DELTASONE Take 2 tablets (40 mg total) by mouth daily with breakfast. Start taking on:  07/20/2017   predniSONE 20 MG tablet Commonly known as:  DELTASONE Take 1 tablet (20 mg total) by mouth daily with breakfast. Start taking on:  07/25/2017   sulfamethoxazole-trimethoprim 800-160 MG tablet Commonly known as:  BACTRIM DS,SEPTRA DS Take 2 tablets by mouth 3 (three) times daily for 16 days.   sulfamethoxazole-trimethoprim 400-80 MG tablet Commonly  known as:  BACTRIM Take 1 tablet by mouth  daily. Start taking on:  08/05/2017        Follow-up Information    [facility] for Infectious Disease Follow up.   Specialty:  Infectious Diseases Why:  appt on 08/08/17 Contact information: [address] [place], [place] [address] [id number] mc [place] [address] [phone]          TOTAL DISCHARGE TIME: 35 minutes  Osvaldo Shipper  [facility] Pager [phone]  07/19/2017, 3:19 PM

## 2017-07-19 NOTE — Progress Notes (Signed)
INFECTIOUS DISEASE PROGRESS NOTE  ID: Grace RightJamie Randall is a 47 y.o. female with  Principal Problem:   PNA (pneumonia) Active Problems:   Acute respiratory failure with hypoxia (HCC)   HIV (human immunodeficiency virus infection) (HCC)   Diarrhea   Anxiety   Depression   AIDS (HCC)   Bipolar affective disorder, currently manic, moderate (HCC)  Subjective: No problems  Abtx:  Anti-infectives (From admission, onward)   Start     Dose/Rate Route Frequency Ordered Stop   07/17/17 2200  sulfamethoxazole-trimethoprim (BACTRIM DS,SEPTRA DS) 800-160 MG per tablet 2 tablet     2 tablet Oral Every 8 hours 07/17/17 1711     07/15/17 1600  bictegravir-emtricitabine-tenofovir AF (BIKTARVY) 50-200-25 MG per tablet 1 tablet     1 tablet Oral Daily 07/15/17 1438     07/15/17 1000  azithromycin (ZITHROMAX) tablet 1,200 mg     1,200 mg Oral Weekly 07/14/17 1348     07/14/17 1600  sulfamethoxazole-trimethoprim (BACTRIM) 320 mg in dextrose 5 % 500 mL IVPB  Status:  Discontinued     320 mg 346.7 mL/hr over 90 Minutes Intravenous Every 6 hours 07/14/17 1359 07/17/17 1711   07/14/17 0800  azithromycin (ZITHROMAX) 500 mg in sodium chloride 0.9 % 250 mL IVPB  Status:  Discontinued     500 mg 250 mL/hr over 60 Minutes Intravenous Every 24 hours 07/13/17 0812 07/14/17 1348   07/14/17 0600  cefTRIAXone (ROCEPHIN) 1 g in sodium chloride 0.9 % 100 mL IVPB  Status:  Discontinued     1 g 200 mL/hr over 30 Minutes Intravenous Every 24 hours 07/13/17 0812 07/14/17 1348   07/13/17 0530  cefTRIAXone (ROCEPHIN) 1 g in sodium chloride 0.9 % 100 mL IVPB     1 g 200 mL/hr over 30 Minutes Intravenous  Once 07/13/17 0518 07/13/17 0658   07/13/17 0530  azithromycin (ZITHROMAX) 500 mg in sodium chloride 0.9 % 250 mL IVPB     500 mg 250 mL/hr over 60 Minutes Intravenous  Once 07/13/17 0518 07/13/17 0811      Medications:  Scheduled: . azithromycin  1,200 mg Oral Weekly  . bictegravir-emtricitabine-tenofovir AF  1  tablet Oral Daily  . enoxaparin (LOVENOX) injection  40 mg Subcutaneous Q24H  . ferrous sulfate  325 mg Oral BID WC  . meclizine  12.5 mg Oral TID  . nicotine  7 mg Transdermal Daily  . [START ON 07/20/2017] predniSONE  40 mg Oral Q breakfast   Followed by  . [START ON 07/25/2017] predniSONE  20 mg Oral Q breakfast  . senna-docusate  1 tablet Oral QHS  . sodium chloride flush  3 mL Intravenous Q12H  . sulfamethoxazole-trimethoprim  2 tablet Oral Q8H  . vitamin B-12  1,000 mcg Oral Daily    Objective: Vital signs in last 24 hours: Temp:  [97.7 F (36.5 C)-98.1 F (36.7 C)] 98.1 F (36.7 C) (03/07 0622) Pulse Rate:  [58-64] 58 (03/07 0622) Resp:  [20] 20 (03/07 0622) BP: (105-107)/(57-71) 106/71 (03/07 0622) SpO2:  [97 %-98 %] 97 % (03/07 0622)   General appearance: alert, cooperative and no distress Resp: clear to auscultation bilaterally Cardio: regular rate and rhythm GI: normal findings: bowel sounds normal and soft, non-tender  Lab Results Recent Labs    07/17/17 0416  WBC 10.0  HGB 9.1*  HCT 29.8*  NA 136  K 5.0  CL 103  CO2 25  BUN 8  CREATININE 0.66   Liver Panel No results for input(s):  PROT, ALBUMIN, AST, ALT, ALKPHOS, BILITOT, BILIDIR, IBILI in the last 72 hours. Sedimentation Rate No results for input(s): ESRSEDRATE in the last 72 hours. C-Reactive Protein No results for input(s): CRP in the last 72 hours.  Microbiology: Recent Results (from the past 240 hour(s))  Culture, blood (routine x 2) Call MD if unable to obtain prior to antibiotics being given     Status: None   Collection Time: 07/13/17 10:29 AM  Result Value Ref Range Status   Specimen Description   Final    BLOOD Randall HAND Performed at [facility], [address] [place], [place] [address]    Special Requests   Final    IN PEDIATRIC BOTTLE Blood Culture results may not be optimal due to an excessive volume of blood received in culture bottles Performed at [facility], [address], [place], [place] [address]    Culture   Final    NO GROWTH 5 DAYS Performed at [facility], [address] [place], [place] [address]    Report Status 07/18/2017 FINAL  Final  Culture, blood (routine x 2) Call MD if unable to obtain prior to antibiotics being given     Status: None   Collection Time: 07/13/17 10:30 AM  Result Value Ref Range Status   Specimen Description   Final    BLOOD LEFT ARM Performed at [facility], [address] [place], [place] [address]    Special Requests   Final    BOTTLES DRAWN AEROBIC AND ANAEROBIC Blood Culture adequate volume Performed at [facility], [address], [place], [place] [address]    Culture   Final    NO GROWTH 5 DAYS Performed at [facility], [address], [place], [place] [address]    Report Status 07/18/2017 FINAL  Final    Studies/Results: Mr Grace Randall VH Contrast  Result Date: 07/18/2017 CLINICAL DATA:  Acute headache. History of HIV/aids. Dizziness. Iron infusion. EXAM: MRI HEAD WITHOUT AND WITH CONTRAST TECHNIQUE: Multiplanar, multiecho pulse sequences of the brain and surrounding structures were obtained without and with intravenous contrast. CONTRAST:  15mL MULTIHANCE GADOBENATE DIMEGLUMINE 529 MG/ML IV SOLN COMPARISON:  None. FINDINGS: Brain: The midline structures are normal. There is no acute infarct or acute hemorrhage. No mass lesion, hydrocephalus, dural abnormality or extra-axial collection. Minimal white matter hyperintensity, nonspecific and often present in asymptomatic patients of this age. No age-advanced or lobar predominant atrophy. No chronic microhemorrhage or superficial siderosis. No abnormal contrast enhancement. Vascular: Major intracranial arterial and venous sinus flow voids are preserved. Skull and upper cervical spine: Generalized low T1-weighted signal of the bone marrow is likely secondary to chronic anemia.  Sinuses/Orbits: No fluid levels or advanced mucosal thickening. No mastoid or middle ear effusion. Normal orbits. IMPRESSION: 1. No acute intracranial abnormality. 2. Decreased bone marrow T1-weighted signal is likely due to chronic anemia in the setting of iron infusion therapy. Electronically Signed   By: Deatra Robinson M.D.   On: 07/18/2017 21:02     Assessment/Plan: AIDS  Pneumonia, suspected PCP Diarrhea- resolved Manic Depression  Total days of antibiotics:5bactrim, prednisone biktarvy  She will be able to get her prescriptions at [facility] pharmacy Pharm is working on this currently.  A hand written rx is given to her nurse.   biktarvy 1 po day  Bactrim DS 2 po tid for 13 days (#78)  Prednisone 40mg  po qday for 5 days  Then Prednisone 20mg   po qday for 11 days Will arrange appt in our clinic- she has appt on 08-08-17  I spent a total of 25 minutes with this patient. Greater than 50% of this time was spent counseling and coordinating care for this patient.           Johny Sax MD, FACP Infectious Diseases (pager) [phone] [email or web address] 07/19/2017, 11:33 AM  LOS: 6 days

## 2017-07-19 NOTE — Progress Notes (Signed)
Got her the 30d supply of Biktarvy for discharge. Faxed in the application for the extension today. [facility] [employer] and [facility]

## 2017-07-19 NOTE — Progress Notes (Signed)
This CM confirmed that pt has Medicaid for prescription coverage. For prednisone, and septra to [facility], pt total responsibility would be $2 for both meds. [facility]. Sandford Crazeora Alexsander Cavins RN,BSN,NCM [phone]

## 2017-07-24 LAB — HIV GENOSURE(R) MG

## 2017-07-24 LAB — REFLEX TO GENOSURE(R) MG EDI: HIV GenoSure(R): 1

## 2017-07-24 LAB — HIV-1 RNA, PCR (GRAPH) RFX/GENO EDI
HIV-1 RNA BY PCR: 168000 copies/mL
HIV-1 RNA QUANT, LOG: 5.225 {Log_copies}/mL

## 2017-07-26 ENCOUNTER — Other Ambulatory Visit: Payer: Self-pay

## 2017-07-26 ENCOUNTER — Emergency Department (HOSPITAL_COMMUNITY)
Admission: EM | Admit: 2017-07-26 | Discharge: 2017-07-26 | Disposition: A | Discharge status: Home / Self Care | Payer: Medicare Other | Attending: Emergency Medicine | Admitting: Emergency Medicine

## 2017-07-26 ENCOUNTER — Encounter (HOSPITAL_COMMUNITY): Payer: Self-pay

## 2017-07-26 DIAGNOSIS — F1410 Cocaine abuse, uncomplicated: Secondary | ICD-10-CM | POA: Insufficient documentation

## 2017-07-26 DIAGNOSIS — R1310 Dysphagia, unspecified: Secondary | ICD-10-CM | POA: Diagnosis present

## 2017-07-26 DIAGNOSIS — B370 Candidal stomatitis: Secondary | ICD-10-CM | POA: Insufficient documentation

## 2017-07-26 DIAGNOSIS — Z79899 Other long term (current) drug therapy: Secondary | ICD-10-CM | POA: Insufficient documentation

## 2017-07-26 DIAGNOSIS — J029 Acute pharyngitis, unspecified: Secondary | ICD-10-CM | POA: Diagnosis not present

## 2017-07-26 DIAGNOSIS — F17210 Nicotine dependence, cigarettes, uncomplicated: Secondary | ICD-10-CM | POA: Diagnosis not present

## 2017-07-26 DIAGNOSIS — B20 Human immunodeficiency virus [HIV] disease: Secondary | ICD-10-CM | POA: Diagnosis not present

## 2017-07-26 DIAGNOSIS — F319 Bipolar disorder, unspecified: Secondary | ICD-10-CM | POA: Insufficient documentation

## 2017-07-26 HISTORY — DX: Herpesviral infection, unspecified: B00.9

## 2017-07-26 HISTORY — DX: Pneumonia, unspecified organism: J18.9

## 2017-07-26 MED ORDER — FLUCONAZOLE 200 MG PO TABS
200.0000 mg | ORAL_TABLET | Freq: Once | ORAL | Status: AC
  Administered 2017-07-26: 200 mg via ORAL
  Filled 2017-07-26: qty 1

## 2017-07-26 MED ORDER — FLUCONAZOLE 200 MG PO TABS: 200.0000 mg | ORAL_TABLET | Freq: Every day | ORAL | 0 refills | Status: AC

## 2017-07-26 NOTE — ED Triage Notes (Signed)
patient c/o oral pain when swallowing; burning when she swallows or drinks liquids x 1 1/2 weeks.

## 2017-07-26 NOTE — Discharge Instructions (Signed)
You have oral thrush.  Please take fluconazole daily for the next 14 days.  Please go to your appointment with infectious disease doctor for recheck.  Return to the emergency department immediately if you have fever or have any new or concerning symptoms.

## 2017-07-26 NOTE — ED Notes (Signed)
Pt responded in lobby to name that was similar to her but not this particular Pt. Pt became irate with Tech saying we need to "pronounce our names correctly." Pt cursed back to her seat in lobby. Pt was getting into verbal confrontation with another Pt in lobby that was backing Tech up saying they were pronouncing name correctly. Pt became hostile and went outside.

## 2017-07-26 NOTE — ED Provider Notes (Signed)
[facility]-EMERGENCY DEPT Provider Note   CSN: [id number] Arrival date & time: 07/26/17  1443     History   Chief Complaint Chief Complaint  Patient presents with  . Dysphagia    HPI Grace Randall is a 47 y.o. female.  HPI   Grace Randall is a 47yo female with a history of HIV (CD4 count 50), anxiety, depression who presents to the emergency department for evaluation of "I think I have thrush."  Patient states that she has white spots on her tongue which she noticed about a week and a half ago.  Of note, she was recently hospitalized with pneumonia and is on both Zithromax and Bactrim.  She states that she has burning pain over the tongue and throat when she swallows and drinks liquids.  She denies pain outside of swallowing.  Denies fevers, chills, congestion, body aches, headache.  She is able to swallow food and liquid, although painful.  Past Medical History:  Diagnosis Date  . Anxiety   . Depression   . Herpes   . HIV (human immunodeficiency virus infection) (HCC) 2008  . Pneumonia     Patient Active Problem List   Diagnosis Date Noted  . Bipolar affective disorder, currently manic, moderate (HCC)   . AIDS (HCC)   . PNA (pneumonia) 07/13/2017  . Acute respiratory failure with hypoxia (HCC) 07/13/2017  . HIV (human immunodeficiency virus infection) (HCC) 07/13/2017  . Diarrhea 07/13/2017  . Anxiety   . Depression   . Dehydration     Past Surgical History:  Procedure Laterality Date  . DENTAL SURGERY     all teeth cut out  . INDUCED ABORTION  1988    OB History    No data available       Home Medications    Prior to Admission medications   Medication Sig Start Date End Date Taking? Authorizing Provider  azithromycin (ZITHROMAX) 600 MG tablet Take 2 tablets (1,200 mg total) by mouth once a week. 07/22/17   Osvaldo Shipper, MD  bictegravir-emtricitabine-tenofovir AF (BIKTARVY) 50-200-25 MG TABS tablet Take 1 tablet by mouth daily. 07/19/17    Ginnie Smart, MD  ferrous sulfate 325 (65 FE) MG tablet Take 1 tablet (325 mg total) by mouth 2 (two) times daily with a meal. 07/19/17   Osvaldo Shipper, MD  meclizine (ANTIVERT) 12.5 MG tablet Take 3 times daily for 3 days and then as needed for vertigo 07/19/17   Osvaldo Shipper, MD  nicotine (NICODERM CQ - DOSED IN MG/24 HOURS) 14 mg/24hr patch Place 1 patch (14 mg total) onto the skin daily. 07/20/17   Osvaldo Shipper, MD  polyethylene glycol [employer]) packet Take 17 g by mouth daily as needed. 07/19/17   Osvaldo Shipper, MD  predniSONE (DELTASONE) 20 MG tablet Take 2 tablets (40 mg total) by mouth daily with breakfast. 07/20/17   Osvaldo Shipper, MD  predniSONE (DELTASONE) 20 MG tablet Take 1 tablet (20 mg total) by mouth daily with breakfast. 07/25/17   Osvaldo Shipper, MD  sulfamethoxazole-trimethoprim (BACTRIM DS,SEPTRA DS) 800-160 MG tablet Take 2 tablets by mouth 3 (three) times daily for 16 days. 07/19/17 08/04/17  Ginnie Smart, MD  sulfamethoxazole-trimethoprim (BACTRIM) 400-80 MG tablet Take 1 tablet by mouth daily. 08/05/17   Ginnie Smart, MD  vitamin B-12 1000 MCG tablet Take 1 tablet (1,000 mcg total) by mouth daily. 07/20/17   Osvaldo Shipper, MD    Family History Family History  Problem Relation Age of Onset  .  COPD Mother   . Prostate cancer Father     Social History Social History   Tobacco Use  . Smoking status: Current Some Day Smoker  . Smokeless tobacco: Never Used  Substance Use Topics  . Alcohol use: No    Frequency: Never  . Drug use: Yes    Types: Marijuana     Allergies   Other   Review of Systems Review of Systems  Constitutional: Negative for chills and fever.  HENT: Positive for sore throat. Negative for congestion, ear pain, rhinorrhea and trouble swallowing.   Skin: Positive for color change ( White spots on tongue).  Neurological: Negative for headaches.     Physical Exam Updated Vital Signs BP 129/78   Pulse 89   Temp 98.4 F  (36.9 C) (Oral)   Resp 18   Ht 5' 5.5" (1.664 m)   Wt 78 kg (172 lb)   LMP 07/12/2017   SpO2 93%   BMI 28.19 kg/m   Physical Exam  Constitutional: She appears well-developed and well-nourished. No distress.  HENT:  Head: Normocephalic and atraumatic.  Mucous membranes moist.  Several white plaques noted on tongue, uvula, posterior oropharynx and soft palate.  Uvula midline.  Airway patent.  Able to handle oral secretions.  Eyes: Conjunctivae are normal. Pupils are equal, round, and reactive to light. Right eye exhibits no discharge. Left eye exhibits no discharge.  Neck: Normal range of motion. Neck supple.  Pulmonary/Chest: Effort normal. No respiratory distress.  Lymphadenopathy:    She has no cervical adenopathy.  Neurological: She is alert. Coordination normal.  Skin: She is not diaphoretic.  Psychiatric: She has a normal mood and affect. Her behavior is normal.  Nursing note and vitals reviewed.    ED Treatments / Results  Labs (all labs ordered are listed, but only abnormal results are displayed) Labs Reviewed - No data to display  EKG  EKG Interpretation None       Radiology No results found.  Procedures Procedures (including critical care time)  Medications Ordered in ED Medications  fluconazole (DIFLUCAN) tablet 200 mg (200 mg Oral Given 07/26/17 1929)     Initial Impression / Assessment and Plan / ED Course  I have reviewed the triage vital signs and the nursing notes.  Pertinent labs & imaging results that were available during my care of the patient were reviewed by me and considered in my medical decision making (see chart for details).    Exam consistent with thrush.  Likely due to her HIV status and recent antibiotic use.  On exam she is afebrile and nontoxic-appearing.  No signs of dehydration.  No concern for systemic infection.  Will treat with p.o. diflucan.  Patient given first dose of diflucan in the emergency department.  Counseled her to  follow-up with her regular doctor for recheck.  Counseled her on strict return precautions including development of a fever and she agrees and voiced understanding to the plan.  She has no complaints prior to discharge.  Final Clinical Impressions(s) / ED Diagnoses   Final diagnoses:  Thrush, oral    ED Discharge Orders        Ordered    fluconazole (DIFLUCAN) 200 MG tablet  Daily     07/26/17 1907       Lawrence MarseillesShrosbree, Emily J, PA-C 07/27/17 1704    Mancel BaleWentz, Elliott, MD 07/27/17 [phone]

## 2017-07-27 MED FILL — FLUCONAZOLE 200 MG TAB: 200 | 14 days supply | Qty: 14 | Fill #0

## 2017-08-07 LAB — HIV GENOSURE(R) MG

## 2017-08-07 LAB — REFLEX TO GENOSURE(R) MG EDI: HIV GenoSure(R): 1

## 2017-08-07 LAB — HIV-1 RNA, PCR (GRAPH) RFX/GENO EDI
HIV-1 RNA BY PCR: 279000 {copies}/mL
HIV-1 RNA Quant, Log: 5.446 log10copy/mL

## 2017-08-08 ENCOUNTER — Inpatient Hospital Stay: Payer: Medicare Other | Admitting: Infectious Diseases

## 2017-08-09 ENCOUNTER — Other Ambulatory Visit: Payer: Self-pay | Admitting: Pharmacist Clinician (PhC)/ Clinical Pharmacy Specialist

## 2017-08-09 ENCOUNTER — Telehealth: Payer: Self-pay | Admitting: Pharmacist Clinician (PhC)/ Clinical Pharmacy Specialist

## 2017-08-09 MED ORDER — MECLIZINE HCL 12.5 MG PO TABS: ORAL_TABLET | ORAL | 0 refills | Status: AC

## 2017-08-09 MED FILL — SULFAMETHOXAZOLE-TMP SS TAB: 400-80 | 30 days supply | Qty: 30 | Fill #0

## 2017-08-09 MED FILL — BIKTARVY 50-200-25 MG TABS: 50-200-25 | 30 days supply | Qty: 30 | Fill #1

## 2017-08-09 MED FILL — AZITHROMYCIN 600 MG TABLET: 600 | 14 days supply | Qty: 4 | Fill #1

## 2017-08-09 MED FILL — MECLIZINE 25 MG TABLET: 25 | 10 days supply | Qty: 15 | Fill #0

## 2017-08-09 NOTE — Telephone Encounter (Signed)
Grace MuirJamie was the one that was very mean to the staff here while she was in the hospital. She has an appt coming up with Dr Ninetta LightsHatcher on Monday. Sending her enough refill for meclizine. She is much more pleasant today.

## 2017-08-09 NOTE — Progress Notes (Unsigned)
Send enough supply until appt on Monday.

## 2017-08-13 ENCOUNTER — Encounter: Payer: Self-pay | Admitting: Infectious Diseases

## 2017-08-13 ENCOUNTER — Ambulatory Visit (INDEPENDENT_AMBULATORY_CARE_PROVIDER_SITE_OTHER): Payer: Medicare Other | Admitting: Infectious Diseases

## 2017-08-13 VITALS — BP 108/74 | HR 91 | Temp 97.8°F | Ht 66.5 in | Wt 174.0 lb

## 2017-08-13 DIAGNOSIS — H4010X0 Unspecified open-angle glaucoma, stage unspecified: Secondary | ICD-10-CM

## 2017-08-13 DIAGNOSIS — B20 Human immunodeficiency virus [HIV] disease: Secondary | ICD-10-CM | POA: Diagnosis not present

## 2017-08-13 DIAGNOSIS — F3112 Bipolar disorder, current episode manic without psychotic features, moderate: Secondary | ICD-10-CM | POA: Diagnosis not present

## 2017-08-13 DIAGNOSIS — B379 Candidiasis, unspecified: Secondary | ICD-10-CM | POA: Insufficient documentation

## 2017-08-13 DIAGNOSIS — H409 Unspecified glaucoma: Secondary | ICD-10-CM | POA: Insufficient documentation

## 2017-08-13 MED ORDER — FLUCONAZOLE 100 MG PO TABS: 100.0000 mg | ORAL_TABLET | Freq: Every day | ORAL | 0 refills | Status: DC

## 2017-08-13 MED FILL — FLUCONAZOLE 100 MG TABLET: 100 | 10 days supply | Qty: 10 | Fill #0

## 2017-08-13 NOTE — Assessment & Plan Note (Signed)
Will have her seen by optho

## 2017-08-13 NOTE — Assessment & Plan Note (Signed)
Will give her second round of diflucan for abd rash.

## 2017-08-13 NOTE — Assessment & Plan Note (Signed)
Will gte her set up with psych.

## 2017-08-13 NOTE — Telephone Encounter (Signed)
Thanks [place]h I reminder her of the expectation to be polite to the staff today. She was in agreement with this.  thanks

## 2017-08-13 NOTE — Addendum Note (Signed)
Addended byJimmy Picket: ABBITT, KATRINA F on: 08/13/2017 04:00 PM   Modules accepted: Orders

## 2017-08-13 NOTE — Progress Notes (Signed)
   Subjective:    Patient ID: Grace Randall, female    DOB: 04/16/1971, 47 y.o.   MRN: [id number]  HPI 47 y.o. female with hx of HIV+ dx (2008) [place]. She was married to a HIV+ man who did not disclose his status. Her only prev rx is atripla, she has been off for several years. She had previous genotype that showed pan-sens virus. She did have diarrhea with the atripla.   She comes to hospital 3-1 with 3-4 months of feeling poorly. She developed worsening sob over last month and then temp to 102. Cough that was non-productive. Progressive SOB, DOE.  In ED she had temp 101.5 and had SpO2 90%. She had CT showing: Diffuse bilateral and upper lobe predominant ground-glass and hazy airspace density concerning for pneumonia.   She was treated for PCP (bactrim, steroids). She was started on biktarvy.  Her course was also complicated by outbursts with nursing staff. She was prev on respiradol, seroquel for sleep.  Worried she has thrush. She took 14 days of flucon- has resolved orally but still having pain in her abd.    CD4 T Cell Abs (/uL)  Date Value  07/13/2017 50 (L)    Review of Systems  Constitutional: Negative for appetite change, chills, fever and unexpected weight change.  Respiratory: Negative for cough and shortness of breath.   Gastrointestinal: Positive for constipation. Negative for diarrhea.  Genitourinary: Negative for difficulty urinating.  Psychiatric/Behavioral: Negative for dysphoric mood.  Please see HPI. All other systems reviewed and negative.     Objective:   Physical Exam  Constitutional: She appears well-developed and well-nourished.  HENT:  Mouth/Throat: No oropharyngeal exudate.  Eyes: Pupils are equal, round, and reactive to light. EOM are normal.  Neck: Neck supple.  Cardiovascular: Normal rate, regular rhythm and normal heart sounds.  Pulmonary/Chest: Effort normal and breath sounds normal.  Abdominal: Soft. Bowel sounds are normal.  There is no tenderness. There is no rebound.  Musculoskeletal: She exhibits no edema.  Lymphadenopathy:    She has no cervical adenopathy.  Skin:     Psychiatric: Her affect is labile. Her speech is rapid and/or pressured.          Assessment & Plan:

## 2017-08-13 NOTE — Assessment & Plan Note (Addendum)
She appears to be doing well.  Offered/refused condoms.  Will check her labs today Pap mammo Get her ophtho exam Will see her back in 3-4 months.

## 2017-08-14 LAB — CBC
HCT: 37 % (ref 35.0–45.0)
HEMOGLOBIN: 12.2 g/dL (ref 11.7–15.5)
MCH: 27.4 pg (ref 27.0–33.0)
MCHC: 33 g/dL (ref 32.0–36.0)
MCV: 83 fL (ref 80.0–100.0)
MPV: 9.7 fL (ref 7.5–12.5)
Platelets: 262 10*3/uL (ref 140–400)
RBC: 4.46 10*6/uL (ref 3.80–5.10)
WBC: 3.5 10*3/uL — ABNORMAL LOW (ref 3.8–10.8)

## 2017-08-14 LAB — COMPREHENSIVE METABOLIC PANEL
AG Ratio: 1.3 (calc) (ref 1.0–2.5)
ALBUMIN MSPROF: 3.5 g/dL — AB (ref 3.6–5.1)
ALKALINE PHOSPHATASE (APISO): 89 U/L (ref 33–115)
ALT: 27 U/L (ref 6–29)
AST: 25 U/L (ref 10–35)
BILIRUBIN TOTAL: 0.2 mg/dL (ref 0.2–1.2)
BUN: 11 mg/dL (ref 7–25)
CALCIUM: 8.7 mg/dL (ref 8.6–10.2)
CO2: 28 mmol/L (ref 20–32)
CREATININE: 0.69 mg/dL (ref 0.50–1.10)
Chloride: 104 mmol/L (ref 98–110)
Globulin: 2.7 g/dL (calc) (ref 1.9–3.7)
Glucose, Bld: 96 mg/dL (ref 65–99)
POTASSIUM: 3.9 mmol/L (ref 3.5–5.3)
Sodium: 138 mmol/L (ref 135–146)
Total Protein: 6.2 g/dL (ref 6.1–8.1)

## 2017-08-15 LAB — T-HELPER CELL (CD4) - (RCID CLINIC ONLY)
CD4 % Helper T Cell: 16 % — ABNORMAL LOW (ref 33–55)
CD4 T Cell Abs: 170 /uL — ABNORMAL LOW (ref 400–2700)

## 2017-08-15 LAB — HIV-1 RNA ULTRAQUANT REFLEX TO GENTYP+
HIV 1 RNA Quant: 49 Copies/mL — ABNORMAL HIGH
HIV-1 RNA QUANT, LOG: 1.69 {Log_copies}/mL — AB

## 2017-08-31 ENCOUNTER — Other Ambulatory Visit: Payer: Self-pay | Admitting: Infectious Diseases

## 2017-08-31 DIAGNOSIS — B379 Candidiasis, unspecified: Secondary | ICD-10-CM

## 2017-09-04 ENCOUNTER — Other Ambulatory Visit: Payer: Self-pay | Admitting: Infectious Diseases

## 2017-09-04 MED FILL — SULFAMETHOXAZOLE-TMP SS TAB: 400-80 | 30 days supply | Qty: 30 | Fill #1

## 2017-09-05 ENCOUNTER — Other Ambulatory Visit: Payer: Self-pay

## 2017-09-12 ENCOUNTER — Telehealth: Payer: Self-pay | Admitting: *Deleted

## 2017-09-12 NOTE — Telephone Encounter (Signed)
Addended to add medicare codes for lab draws. Andree Coss, RN

## 2017-09-14 MED FILL — BIKTARVY 50-200-25 MG TABS: 50-200-25 | 30 days supply | Qty: 30 | Fill #0

## 2017-09-20 ENCOUNTER — Encounter: Payer: Self-pay | Admitting: Behavioral Health

## 2017-09-27 ENCOUNTER — Ambulatory Visit (INDEPENDENT_AMBULATORY_CARE_PROVIDER_SITE_OTHER): Payer: Medicare Other | Admitting: Infectious Diseases

## 2017-09-27 ENCOUNTER — Encounter: Payer: Self-pay | Admitting: Infectious Diseases

## 2017-09-27 VITALS — BP 101/69 | HR 82 | Temp 98.0°F | Wt 189.0 lb

## 2017-09-27 DIAGNOSIS — A609 Anogenital herpesviral infection, unspecified: Secondary | ICD-10-CM

## 2017-09-27 DIAGNOSIS — Z79899 Other long term (current) drug therapy: Secondary | ICD-10-CM

## 2017-09-27 DIAGNOSIS — Z113 Encounter for screening for infections with a predominantly sexual mode of transmission: Secondary | ICD-10-CM | POA: Diagnosis not present

## 2017-09-27 DIAGNOSIS — B20 Human immunodeficiency virus [HIV] disease: Secondary | ICD-10-CM

## 2017-09-27 DIAGNOSIS — Z23 Encounter for immunization: Secondary | ICD-10-CM

## 2017-09-27 DIAGNOSIS — Z720 Tobacco use: Secondary | ICD-10-CM | POA: Diagnosis not present

## 2017-09-27 DIAGNOSIS — F3112 Bipolar disorder, current episode manic without psychotic features, moderate: Secondary | ICD-10-CM | POA: Diagnosis not present

## 2017-09-27 DIAGNOSIS — L309 Dermatitis, unspecified: Secondary | ICD-10-CM

## 2017-09-27 MED ORDER — VALACYCLOVIR HCL 1 G PO TABS: 1000.0000 mg | ORAL_TABLET | Freq: Two times a day (BID) | ORAL | 2 refills | Status: DC

## 2017-09-27 MED FILL — valACYclovir HCL 1 GM TABS: 1 | 30 days supply | Qty: 60 | Fill #0

## 2017-09-27 NOTE — Assessment & Plan Note (Signed)
Wants to be seen by Derm. Has failed lotions, soap.

## 2017-09-27 NOTE — Assessment & Plan Note (Signed)
Will refer her to psych

## 2017-09-27 NOTE — Progress Notes (Signed)
   Subjective:    Patient ID: Grace Randall, female    DOB: 08/21/1970, 47 y.o.   MRN: [id number]  HPI 47 yo F with hx of HIV+ since 2008. She was prev on atripla came to [facility] off rx.  She was started on biktarvy while being treated for suspected PCP.  She was also noted to have emotional outbursts in hospital.    Today her wt is up 15#. Has been getting up at night and eating. Has been minimally active.  Has been feeling well except for HSV flare up (vaginal). She has prev taken valtrex daily.   HIV 1 RNA Quant (Copies/mL)  Date Value  08/13/2017 49 (H)   CD4 T Cell Abs (/uL)  Date Value  08/13/2017 170 (L)  07/13/2017 50 (L)    Review of Systems  Constitutional: Positive for unexpected weight change. Negative for appetite change, chills and fever.  Gastrointestinal: Negative for constipation and diarrhea.  Genitourinary: Positive for genital sores. Negative for difficulty urinating and menstrual problem.  Psychiatric/Behavioral: Positive for sleep disturbance.  Needs PAP Was prev on seroquel prn for sleep. Does not want to take nightly.  Hasn't been taking her respiradol. Has been fine, smoking marijuana.  Please see HPI. All other systems reviewed and negative.     Objective:   Physical Exam  Constitutional: She is oriented to person, place, and time. She appears well-developed and well-nourished.  HENT:  Mouth/Throat: No oropharyngeal exudate.  Eyes: Pupils are equal, round, and reactive to light. Conjunctivae and EOM are normal.  Neck: Normal range of motion. Neck supple.  Cardiovascular: Normal rate, regular rhythm and normal heart sounds.  Pulmonary/Chest: Effort normal and breath sounds normal.  Abdominal: Soft. Bowel sounds are normal. She exhibits no distension.  Musculoskeletal: Normal range of motion. She exhibits no edema.  Neurological: She is alert and oriented to person, place, and time.  Skin: Skin is dry.  Psychiatric: Her affect is labile.         Assessment & Plan:

## 2017-09-27 NOTE — Assessment & Plan Note (Signed)
Encouraged to quit. 

## 2017-09-27 NOTE — Assessment & Plan Note (Addendum)
She is doing well with biktarvy Offered/refused condoms.  Will give her next Hep B Will see her back in 6 months.  will have her sched for PAP Encouraged her to diet and be active.

## 2017-09-27 NOTE — Assessment & Plan Note (Addendum)
Will refill her valtrex.  1g bid for 3 days then qday.

## 2017-09-27 NOTE — Addendum Note (Signed)
Addended by: Lorenso Courier on: 09/27/2017 02:50 PM   Modules accepted: Orders

## 2017-10-04 ENCOUNTER — Other Ambulatory Visit: Payer: Self-pay | Admitting: Infectious Diseases

## 2017-10-04 MED FILL — SULFAMETHOXAZOLE-TMP SS TAB: 400-80 | 30 days supply | Qty: 30 | Fill #0

## 2017-10-09 ENCOUNTER — Telehealth: Payer: Self-pay | Admitting: Pharmacist

## 2017-10-09 NOTE — Telephone Encounter (Signed)
Takeria called to double check if she could take Valtrex with her Biktarvy. I instructed her that this would be fine.   She does take an iron supplement as well but separates this out appropriately from the [place]. She takes the [facility] in the morning and the iron supplement at night. I encouraged her to continue this practice.    Sharin Mons, PharmD, BCPS PGY2 Infectious Diseases Pharmacy Resident Pager: [phone]

## 2017-10-16 MED FILL — BIKTARVY 50-200-25 MG TABS: 50-200-25 | 30 days supply | Qty: 30 | Fill #1

## 2017-10-24 ENCOUNTER — Other Ambulatory Visit: Payer: Self-pay | Admitting: Infectious Diseases

## 2017-11-12 ENCOUNTER — Telehealth: Payer: Self-pay | Admitting: Pharmacist Clinician (PhC)/ Clinical Pharmacy Specialist

## 2017-11-12 NOTE — Telephone Encounter (Signed)
[facility] pharmacy said that she has been missing a couple of doses of Bactrim and Biktarvy. Left her a generic VM to call back to shore up adherence.

## 2017-11-12 NOTE — Telephone Encounter (Signed)
Thanks

## 2017-11-19 MED FILL — SULFAMETHOXAZOLE-TMP SS TAB: 400-80 | 30 days supply | Qty: 30 | Fill #1

## 2017-11-26 MED FILL — valACYclovir HCL 1 GM TABS: 1 | 30 days supply | Qty: 60 | Fill #1

## 2017-11-26 MED FILL — BIKTARVY 50-200-25 MG TABS: 50-200-25 | 30 days supply | Qty: 30 | Fill #0

## 2017-11-29 ENCOUNTER — Telehealth: Payer: Self-pay | Admitting: *Deleted

## 2017-11-29 NOTE — Telephone Encounter (Signed)
Grace Randall at Dr Sherryl Bartersafeen's office called with an update. Patient had an appointment, but did not bring her medicare card, only medicaid. Grace Randall tried to explain multiple times that she needed the medicare card, how to obtain a new copy, but patient did not listen and in fact caused a major disruption in the lobby, yelling and cursing at staff. Dr Sherryl Bartersafeen's office has declined to accept this patient's referral. She will need to be referred to a different dermatologist and will need to have her medicare card for the first visit. Andree CossHowell, Grace Reichenbach M, RN

## 2017-12-24 ENCOUNTER — Other Ambulatory Visit: Payer: Self-pay | Admitting: Infectious Diseases

## 2017-12-24 DIAGNOSIS — B20 Human immunodeficiency virus [HIV] disease: Secondary | ICD-10-CM

## 2017-12-27 MED FILL — SULFAMETHOXAZOLE-TMP SS TAB: 400-80 | 30 days supply | Qty: 30 | Fill #0

## 2017-12-27 MED FILL — valACYclovir HCL 1 GM TABS: 1 | 30 days supply | Qty: 60 | Fill #2

## 2017-12-27 MED FILL — BIKTARVY 50-200-25 MG TABS: 50-200-25 | 30 days supply | Qty: 30 | Fill #1

## 2018-01-21 ENCOUNTER — Telehealth: Payer: Self-pay

## 2018-01-21 NOTE — Telephone Encounter (Signed)
Called patient to find out when last pap smear was done. Patient is unable to recall when her last pap smear was and states she is not ready for one just yet. Patient stated she is doing well on her meds and has only missed two days since last seen in our office. Lorenso Courier, [place]

## 2018-01-22 NOTE — Telephone Encounter (Signed)
Can you put her in PAP clinic with stephanie please? thanks

## 2018-02-04 ENCOUNTER — Other Ambulatory Visit: Payer: Self-pay | Admitting: Infectious Diseases

## 2018-02-04 DIAGNOSIS — A609 Anogenital herpesviral infection, unspecified: Secondary | ICD-10-CM

## 2018-02-04 MED FILL — valACYclovir HCL 1 GM TABS: 1 | 30 days supply | Qty: 60 | Fill #0

## 2018-02-04 MED FILL — SULFAMETHOXAZOLE-TMP SS TAB: 400-80 | 30 days supply | Qty: 30 | Fill #1

## 2018-02-11 MED FILL — BIKTARVY 50-200-25 MG TABS: 50-200-25 | 30 days supply | Qty: 30 | Fill #2

## 2018-03-18 ENCOUNTER — Other Ambulatory Visit: Payer: Medicare Other

## 2018-03-18 MED FILL — SULFAMETHOXAZOLE-TMP SS TAB: 400-80 | 30 days supply | Qty: 30 | Fill #2

## 2018-03-18 MED FILL — BIKTARVY 50-200-25 MG TABS: 50-200-25 | 30 days supply | Qty: 30 | Fill #3

## 2018-03-19 ENCOUNTER — Other Ambulatory Visit (HOSPITAL_COMMUNITY)
Admission: RE | Admit: 2018-03-19 | Discharge: 2018-03-19 | Disposition: A | Discharge status: Home / Self Care | Payer: Medicare Other | Source: Ambulatory Visit | Attending: Infectious Diseases | Admitting: Infectious Diseases

## 2018-03-19 ENCOUNTER — Other Ambulatory Visit: Payer: Medicare Other

## 2018-03-19 DIAGNOSIS — Z113 Encounter for screening for infections with a predominantly sexual mode of transmission: Secondary | ICD-10-CM | POA: Insufficient documentation

## 2018-03-19 DIAGNOSIS — Z79899 Other long term (current) drug therapy: Secondary | ICD-10-CM | POA: Diagnosis not present

## 2018-03-19 DIAGNOSIS — B20 Human immunodeficiency virus [HIV] disease: Secondary | ICD-10-CM

## 2018-03-20 LAB — T-HELPER CELL (CD4) - (RCID CLINIC ONLY)
CD4 % Helper T Cell: 21 % — ABNORMAL LOW (ref 33–55)
CD4 T Cell Abs: 350 /uL — ABNORMAL LOW (ref 400–2700)

## 2018-03-20 LAB — URINE CYTOLOGY ANCILLARY ONLY
Chlamydia: NEGATIVE
Neisseria Gonorrhea: NEGATIVE

## 2018-03-22 LAB — CBC
HEMATOCRIT: 38.3 % (ref 35.0–45.0)
Hemoglobin: 13.4 g/dL (ref 11.7–15.5)
MCH: 32.6 pg (ref 27.0–33.0)
MCHC: 35 g/dL (ref 32.0–36.0)
MCV: 93.2 fL (ref 80.0–100.0)
MPV: 11.2 fL (ref 7.5–12.5)
Platelets: 202 10*3/uL (ref 140–400)
RBC: 4.11 10*6/uL (ref 3.80–5.10)
RDW: 12.6 % (ref 11.0–15.0)
WBC: 4.8 10*3/uL (ref 3.8–10.8)

## 2018-03-22 LAB — COMPREHENSIVE METABOLIC PANEL
AG Ratio: 1.5 (calc) (ref 1.0–2.5)
ALBUMIN MSPROF: 4.1 g/dL (ref 3.6–5.1)
ALT: 21 U/L (ref 6–29)
AST: 25 U/L (ref 10–35)
Alkaline phosphatase (APISO): 68 U/L (ref 33–115)
BUN: 10 mg/dL (ref 7–25)
CO2: 27 mmol/L (ref 20–32)
Calcium: 9.4 mg/dL (ref 8.6–10.2)
Chloride: 105 mmol/L (ref 98–110)
Creat: 0.85 mg/dL (ref 0.50–1.10)
Globulin: 2.8 g/dL (calc) (ref 1.9–3.7)
Glucose, Bld: 96 mg/dL (ref 65–99)
POTASSIUM: 4 mmol/L (ref 3.5–5.3)
SODIUM: 140 mmol/L (ref 135–146)
Total Bilirubin: 0.3 mg/dL (ref 0.2–1.2)
Total Protein: 6.9 g/dL (ref 6.1–8.1)

## 2018-03-22 LAB — RPR: RPR Ser Ql: NONREACTIVE

## 2018-03-22 LAB — LIPID PANEL
CHOL/HDL RATIO: 3.2 (calc) (ref <5.0)
Cholesterol: 139 mg/dL (ref <200)
HDL: 44 mg/dL — ABNORMAL LOW (ref >50)
LDL Cholesterol (Calc): 76 mg/dL (calc)
NON-HDL CHOLESTEROL (CALC): 95 mg/dL (ref <130)
Triglycerides: 103 mg/dL (ref <150)

## 2018-03-22 LAB — HIV-1 RNA QUANT-NO REFLEX-BLD
HIV 1 RNA QUANT: NOT DETECTED {copies}/mL
HIV-1 RNA QUANT, LOG: NOT DETECTED {Log_copies}/mL

## 2018-04-01 ENCOUNTER — Ambulatory Visit: Payer: Medicare Other | Admitting: Infectious Diseases

## 2018-04-02 ENCOUNTER — Encounter: Payer: Self-pay | Admitting: Infectious Diseases

## 2018-04-02 ENCOUNTER — Ambulatory Visit (INDEPENDENT_AMBULATORY_CARE_PROVIDER_SITE_OTHER): Payer: Medicare Other | Admitting: Infectious Diseases

## 2018-04-02 VITALS — BP 114/75 | HR 72 | Temp 97.7°F | Wt 223.0 lb

## 2018-04-02 DIAGNOSIS — A609 Anogenital herpesviral infection, unspecified: Secondary | ICD-10-CM | POA: Diagnosis not present

## 2018-04-02 DIAGNOSIS — B20 Human immunodeficiency virus [HIV] disease: Secondary | ICD-10-CM | POA: Diagnosis not present

## 2018-04-02 DIAGNOSIS — B379 Candidiasis, unspecified: Secondary | ICD-10-CM

## 2018-04-02 DIAGNOSIS — Z113 Encounter for screening for infections with a predominantly sexual mode of transmission: Secondary | ICD-10-CM | POA: Diagnosis not present

## 2018-04-02 DIAGNOSIS — Z720 Tobacco use: Secondary | ICD-10-CM

## 2018-04-02 DIAGNOSIS — Z79899 Other long term (current) drug therapy: Secondary | ICD-10-CM | POA: Diagnosis not present

## 2018-04-02 DIAGNOSIS — E669 Obesity, unspecified: Secondary | ICD-10-CM

## 2018-04-02 DIAGNOSIS — E66812 Obesity, class 2: Secondary | ICD-10-CM

## 2018-04-02 DIAGNOSIS — Z23 Encounter for immunization: Secondary | ICD-10-CM

## 2018-04-02 MED ORDER — FLUCONAZOLE 100 MG PO TABS: 100.0000 mg | ORAL_TABLET | Freq: Every day | ORAL | 0 refills | Status: AC

## 2018-04-02 NOTE — Assessment & Plan Note (Signed)
Has quit, now vaping.  Some concern that this is driving wt gain.

## 2018-04-02 NOTE — Assessment & Plan Note (Signed)
Taking 1 qday of VTX.  Will stop, take prn.  She c/o pill size.

## 2018-04-02 NOTE — Addendum Note (Signed)
Addended by: Lorenso CourierMALDONADO, JOSE L on: 04/02/2018 03:48 PM   Modules accepted: Orders

## 2018-04-02 NOTE — Assessment & Plan Note (Addendum)
She is doing very well Grace Randall concern that INI is driving wt gain.  PCV today Hep B # 3 today Refuses flu shot Offered/refused condoms.  Stop bactrim She will clarify at desk whether she has PAP appt in Dec.  rtc in 9 months.

## 2018-04-02 NOTE — Assessment & Plan Note (Signed)
Will refill her diflucan.  

## 2018-04-02 NOTE — Progress Notes (Signed)
   Subjective:    Patient ID: Grace Randall, female    DOB: 07/14/1970, 47 y.o.   MRN: [id number]  HPI 47 yo F with hx of HIV+ since 2008. She was prev on atripla came [facility] off rx (March 2019).  She was started on biktarvy while being treated for suspected PCP.  She was also noted to have emotional outbursts in hospital.   Has gained wt. Has quit smoking (has only smoked 0 pack since August 2019). Has been vaping.  Up 62# since 07-2017.  Worried about fungal dermatitis returning under her breasts.  Has been adherent to meds (1 missed dose in last 2 weeks).   HIV 1 RNA Quant  Date Value  03/19/2018 <20 NOT DETECTED copies/mL  08/13/2017 49 Copies/mL (H)   CD4 T Cell Abs (/uL)  Date Value  03/19/2018 350 (L)  08/13/2017 170 (L)  07/13/2017 50 (L)    Review of Systems  Constitutional: Positive for unexpected weight change. Negative for appetite change.  Respiratory: Negative for cough and shortness of breath.   Gastrointestinal: Negative for constipation and diarrhea.  Genitourinary: Positive for menstrual problem. Negative for difficulty urinating.  periods are "all whacky" , irregular.  Next PAP 05-06-18.  Please see HPI. All other systems reviewed and negative.    Objective:   Physical Exam  Constitutional: She is oriented to person, place, and time. She appears well-developed and well-nourished.  HENT:  Mouth/Throat: No oropharyngeal exudate.  Eyes: Pupils are equal, round, and reactive to light. EOM are normal.  Neck: Normal range of motion. Neck supple.  Cardiovascular: Normal rate, regular rhythm and normal heart sounds.  Pulmonary/Chest: Effort normal and breath sounds normal.  Abdominal: Soft. Bowel sounds are normal. She exhibits no distension. There is no tenderness.  Musculoskeletal: She exhibits edema.  Lymphadenopathy:    She has no cervical adenopathy.  Neurological: She is alert and oriented to person, place, and time.  Skin:     Psychiatric: Her speech  is rapid and/or pressured.          Assessment & Plan:

## 2018-04-02 NOTE — Assessment & Plan Note (Signed)
She will start walking with her dogs.  She is going to watch her diet.

## 2018-04-03 MED FILL — FLUCONAZOLE 100 MG TAB: 100 | 7 days supply | Qty: 7 | Fill #0

## 2018-04-16 ENCOUNTER — Encounter: Payer: Self-pay | Admitting: Infectious Diseases

## 2018-04-18 MED FILL — BIKTARVY 50-200-25 MG TABS: 50-200-25 | 30 days supply | Qty: 30 | Fill #4

## 2018-05-06 ENCOUNTER — Ambulatory Visit: Payer: Medicare Other | Admitting: Internal Medicine

## 2018-05-10 MED FILL — SULFAMETHOXAZOLE-TMP SS TAB: 400-80 | 30 days supply | Qty: 30 | Fill #3

## 2018-05-28 MED FILL — BIKTARVY 50-200-25 MG TABS: 50-200-25 | 30 days supply | Qty: 30 | Fill #5

## 2018-07-05 ENCOUNTER — Other Ambulatory Visit: Payer: Self-pay | Admitting: Infectious Diseases

## 2018-07-05 MED FILL — BIKTARVY 50-200-25 MG TABS: 50-200-25 | 30 days supply | Qty: 30 | Fill #0

## 2018-07-05 MED FILL — valACYclovir HCL 1 GM TABS: 1 | 30 days supply | Qty: 60 | Fill #1

## 2018-08-10 ENCOUNTER — Other Ambulatory Visit: Payer: Self-pay | Admitting: Infectious Diseases

## 2018-08-10 DIAGNOSIS — A609 Anogenital herpesviral infection, unspecified: Secondary | ICD-10-CM

## 2018-08-13 MED FILL — BIKTARVY 50-200-25 MG TABS: 50-200-25 | 30 days supply | Qty: 30 | Fill #1

## 2018-08-13 MED FILL — valACYclovir HCL 1 GM TABS: 1 | 30 days supply | Qty: 60 | Fill #0

## 2018-09-17 MED FILL — valACYclovir HCL 1 GM TABS: 1 | 30 days supply | Qty: 60 | Fill #1

## 2018-09-17 MED FILL — BIKTARVY 50-200-25 MG TABS: 50-200-25 | 30 days supply | Qty: 30 | Fill #2

## 2018-10-28 MED FILL — valACYclovir HCL 1 GM TABS: 1 | 30 days supply | Qty: 60 | Fill #2

## 2018-10-28 MED FILL — BIKTARVY 50-200-25 MG TABS: 50-200-25 | 30 days supply | Qty: 30 | Fill #3

## 2018-11-25 MED FILL — BIKTARVY 50-200-25 MG TABS: 50-200-25 | 30 days supply | Qty: 30 | Fill #4

## 2018-11-25 MED FILL — valACYclovir HCL 1 GM TABS: 1 | 30 days supply | Qty: 60 | Fill #3

## 2018-12-23 MED FILL — valACYclovir HCL 1 GM TABS: 1 | 30 days supply | Qty: 60 | Fill #4

## 2018-12-23 MED FILL — BIKTARVY 50-200-25 MG TABS: 50-200-25 | 30 days supply | Qty: 30 | Fill #5

## 2018-12-24 ENCOUNTER — Other Ambulatory Visit: Payer: Medicare Other

## 2019-01-07 ENCOUNTER — Encounter: Payer: Medicare Other | Admitting: Infectious Diseases

## 2019-01-15 ENCOUNTER — Other Ambulatory Visit: Payer: Self-pay | Admitting: Infectious Diseases

## 2019-01-21 MED FILL — BIKTARVY 50-200-25 MG TABS: 50-200-25 | 30 days supply | Qty: 30 | Fill #0

## 2019-01-21 MED FILL — valACYclovir HCL 1 GM TABS: 1 | 30 days supply | Qty: 60 | Fill #5

## 2019-01-28 ENCOUNTER — Ambulatory Visit (INDEPENDENT_AMBULATORY_CARE_PROVIDER_SITE_OTHER): Payer: Medicare Other | Admitting: Infectious Diseases

## 2019-01-28 ENCOUNTER — Other Ambulatory Visit: Payer: Self-pay

## 2019-01-28 ENCOUNTER — Encounter: Payer: Self-pay | Admitting: Infectious Diseases

## 2019-01-28 VITALS — BP 119/78 | HR 73 | Temp 98.4°F

## 2019-01-28 DIAGNOSIS — Z79899 Other long term (current) drug therapy: Secondary | ICD-10-CM

## 2019-01-28 DIAGNOSIS — Z720 Tobacco use: Secondary | ICD-10-CM | POA: Diagnosis not present

## 2019-01-28 DIAGNOSIS — E669 Obesity, unspecified: Secondary | ICD-10-CM | POA: Diagnosis not present

## 2019-01-28 DIAGNOSIS — A609 Anogenital herpesviral infection, unspecified: Secondary | ICD-10-CM | POA: Diagnosis not present

## 2019-01-28 DIAGNOSIS — Z113 Encounter for screening for infections with a predominantly sexual mode of transmission: Secondary | ICD-10-CM | POA: Diagnosis not present

## 2019-01-28 DIAGNOSIS — E66812 Obesity, class 2: Secondary | ICD-10-CM

## 2019-01-28 DIAGNOSIS — F3112 Bipolar disorder, current episode manic without psychotic features, moderate: Secondary | ICD-10-CM

## 2019-01-28 DIAGNOSIS — B20 Human immunodeficiency virus [HIV] disease: Secondary | ICD-10-CM

## 2019-01-28 DIAGNOSIS — R600 Localized edema: Secondary | ICD-10-CM | POA: Diagnosis not present

## 2019-01-28 NOTE — Assessment & Plan Note (Signed)
Has LLE edema She does not want u/s.  She states she has had > 1 yr.  She believe it will improve with exercise, wt loss.

## 2019-01-28 NOTE — Assessment & Plan Note (Signed)
Continues on suppressive valtrex.

## 2019-01-28 NOTE — Progress Notes (Signed)
   Subjective:    Patient ID: Grace Randall, female    DOB: 23-May-1970, 48 y.o.   MRN: [id number]  HPI 48 yo F with hx of HIV+ since 2008. She was prev on atripla came to [facility] off rx (March 2019).  She was started on biktarvy while being treated for suspected PCP.  She was also noted to have emotional outbursts in hospital.  Since her new ART, she has gained significant weight.  Feels "good", hasn't had "that much energy".  Has extra bottle of pills. She will address with pharm Takes VTX qday as preventative unless she has outbreak (then bid 5 days).  Off Vit B12 shots.  Refuses flu shot. Got sick from PCV13 and wants no further.  I haven't had a pap "in years".  Smokes marijuana often, "I self medicate" (as opposed to taking respirdal and seroquel). "It keeps me calm!"  HIV 1 RNA Quant  Date Value  03/19/2018 <20 NOT DETECTED copies/mL  08/13/2017 49 Copies/mL (H)   CD4 T Cell Abs (/uL)  Date Value  03/19/2018 350 (L)  08/13/2017 170 (L)  07/13/2017 50 (L)    Review of Systems  Constitutional: Positive for fatigue. Negative for appetite change, chills, fever and unexpected weight change.  Respiratory: Negative for cough and shortness of breath.   Gastrointestinal: Negative for constipation and diarrhea.  Genitourinary: Negative for difficulty urinating and menstrual problem.  Psychiatric/Behavioral: Negative for sleep disturbance.  Please see HPI. All other systems reviewed and negative.     Objective:   Physical Exam Constitutional:      Appearance: Normal appearance. She is obese.  HENT:     Mouth/Throat:     Mouth: Mucous membranes are moist.     Pharynx: No oropharyngeal exudate.  Eyes:     Extraocular Movements: Extraocular movements intact.     Pupils: Pupils are equal, round, and reactive to light.  Neck:     Musculoskeletal: Normal range of motion and neck supple.  Cardiovascular:     Rate and Rhythm: Normal rate and regular rhythm.  Pulmonary:     Effort:  Pulmonary effort is normal.     Breath sounds: Normal breath sounds.  Abdominal:     General: Bowel sounds are normal. There is no distension.     Palpations: Abdomen is soft.     Tenderness: There is no abdominal tenderness.  Musculoskeletal:     Right lower leg: No edema.     Left lower leg: Edema present.  Neurological:     General: No focal deficit present.     Mental Status: She is alert and oriented to person, place, and time.  Psychiatric:        Mood and Affect: Mood normal.        Speech: Speech is rapid and pressured.        Behavior: Behavior normal.       Assessment & Plan:

## 2019-01-28 NOTE — Assessment & Plan Note (Addendum)
Refuses  Flu Refuses PNVx Given condoms (for her son).  Refuses PAP Await 48 yo to get mammo. Had aunt with breast ca.  Labs today rtc in 9 months

## 2019-01-28 NOTE — Assessment & Plan Note (Signed)
Has quit smoking.  

## 2019-01-28 NOTE — Addendum Note (Signed)
Addended by: Reggy Eye on: 01/28/2019 04:08 PM   Modules accepted: Orders

## 2019-01-28 NOTE — Assessment & Plan Note (Signed)
She is off meds, out of therapy.  Continues to "self medicate".

## 2019-01-28 NOTE — Assessment & Plan Note (Signed)
Scale broken here at [facility].  She will start exercise, diet.

## 2019-01-30 LAB — COMPREHENSIVE METABOLIC PANEL
AG Ratio: 1.3 (calc) (ref 1.0–2.5)
ALT: 28 U/L (ref 6–29)
AST: 28 U/L (ref 10–35)
Albumin: 4 g/dL (ref 3.6–5.1)
Alkaline phosphatase (APISO): 113 U/L (ref 31–125)
BUN/Creatinine Ratio: 6 (calc) (ref 6–22)
BUN: 6 mg/dL — ABNORMAL LOW (ref 7–25)
CO2: 27 mmol/L (ref 20–32)
Calcium: 9.3 mg/dL (ref 8.6–10.2)
Chloride: 104 mmol/L (ref 98–110)
Creat: 0.95 mg/dL (ref 0.50–1.10)
Globulin: 3.2 g/dL (calc) (ref 1.9–3.7)
Glucose, Bld: 97 mg/dL (ref 65–99)
Potassium: 3.9 mmol/L (ref 3.5–5.3)
Sodium: 139 mmol/L (ref 135–146)
Total Bilirubin: 0.6 mg/dL (ref 0.2–1.2)
Total Protein: 7.2 g/dL (ref 6.1–8.1)

## 2019-01-30 LAB — HELPER T-LYMPH-CD4 (ARMC ONLY)
% CD 4 Pos. Lymph.: 46.6 % (ref 30.8–58.5)
Absolute CD 4 Helper: 792 /uL (ref 359–1519)
Basophils Absolute: 0 10*3/uL (ref 0.0–0.2)
Basos: 1 %
EOS (ABSOLUTE): 0.1 10*3/uL (ref 0.0–0.4)
Eos: 2 %
Hematocrit: 41.1 % (ref 34.0–46.6)
Hemoglobin: 13.9 g/dL (ref 11.1–15.9)
Immature Grans (Abs): 0 10*3/uL (ref 0.0–0.1)
Immature Granulocytes: 0 %
Lymphocytes Absolute: 1.7 10*3/uL (ref 0.7–3.1)
Lymphs: 27 %
MCH: 30.3 pg (ref 26.6–33.0)
MCHC: 33.8 g/dL (ref 31.5–35.7)
MCV: 90 fL (ref 79–97)
Monocytes Absolute: 0.4 10*3/uL (ref 0.1–0.9)
Monocytes: 7 %
Neutrophils Absolute: 4.1 10*3/uL (ref 1.4–7.0)
Neutrophils: 63 %
Platelets: 229 10*3/uL (ref 150–450)
RBC: 4.58 x10E6/uL (ref 3.77–5.28)
RDW: 13.1 % (ref 11.7–15.4)
WBC: 6.4 10*3/uL (ref 3.4–10.8)

## 2019-01-30 LAB — CBC
HCT: 39.9 % (ref 35.0–45.0)
Hemoglobin: 13.6 g/dL (ref 11.7–15.5)
MCH: 30 pg (ref 27.0–33.0)
MCHC: 34.1 g/dL (ref 32.0–36.0)
MCV: 88.1 fL (ref 80.0–100.0)
MPV: 10.9 fL (ref 7.5–12.5)
Platelets: 219 10*3/uL (ref 140–400)
RBC: 4.53 10*6/uL (ref 3.80–5.10)
RDW: 13 % (ref 11.0–15.0)
WBC: 6.6 10*3/uL (ref 3.8–10.8)

## 2019-01-30 LAB — HIV-1 RNA QUANT-NO REFLEX-BLD
HIV 1 RNA Quant: <20 copies/mL
HIV-1 RNA Quant, Log: <1.3 Log copies/mL

## 2019-01-30 LAB — RPR: RPR Ser Ql: NONREACTIVE

## 2019-02-14 MED FILL — BIKTARVY 50-200-25 MG TABS: 50-200-25 | 30 days supply | Qty: 30 | Fill #1

## 2019-03-17 MED FILL — BIKTARVY 50-200-25 MG TABS: 50-200-25 | 30 days supply | Qty: 30 | Fill #2

## 2019-05-19 MED FILL — BIKTARVY 50-200-25 MG TABS: 50-200-25 | 30 days supply | Qty: 30 | Fill #3

## 2019-07-15 ENCOUNTER — Other Ambulatory Visit: Payer: Self-pay | Admitting: Infectious Diseases

## 2019-07-15 MED FILL — BIKTARVY 50-200-25 MG TABS: 50-200-25 | 30 days supply | Qty: 30 | Fill #0

## 2019-09-02 MED FILL — BIKTARVY 50-200-25 MG TABS: 50-200-25 | 30 days supply | Qty: 30 | Fill #1

## 2019-10-01 MED FILL — BIKTARVY 50-200-25 MG TABS: 50-200-25 | 30 days supply | Qty: 30 | Fill #2

## 2019-10-14 ENCOUNTER — Ambulatory Visit: Payer: Medicare Other | Admitting: Infectious Diseases

## 2019-10-21 ENCOUNTER — Other Ambulatory Visit: Payer: Self-pay

## 2019-10-21 ENCOUNTER — Ambulatory Visit (INDEPENDENT_AMBULATORY_CARE_PROVIDER_SITE_OTHER): Payer: Medicare Other | Admitting: Infectious Diseases

## 2019-10-21 VITALS — BP 108/71 | HR 82 | Wt 262.0 lb

## 2019-10-21 DIAGNOSIS — E669 Obesity, unspecified: Secondary | ICD-10-CM

## 2019-10-21 DIAGNOSIS — Z113 Encounter for screening for infections with a predominantly sexual mode of transmission: Secondary | ICD-10-CM

## 2019-10-21 DIAGNOSIS — N761 Subacute and chronic vaginitis: Secondary | ICD-10-CM

## 2019-10-21 DIAGNOSIS — B379 Candidiasis, unspecified: Secondary | ICD-10-CM

## 2019-10-21 DIAGNOSIS — B20 Human immunodeficiency virus [HIV] disease: Secondary | ICD-10-CM

## 2019-10-21 DIAGNOSIS — N760 Acute vaginitis: Secondary | ICD-10-CM | POA: Insufficient documentation

## 2019-10-21 DIAGNOSIS — A609 Anogenital herpesviral infection, unspecified: Secondary | ICD-10-CM | POA: Diagnosis not present

## 2019-10-21 DIAGNOSIS — R600 Localized edema: Secondary | ICD-10-CM | POA: Diagnosis not present

## 2019-10-21 DIAGNOSIS — F3112 Bipolar disorder, current episode manic without psychotic features, moderate: Secondary | ICD-10-CM

## 2019-10-21 DIAGNOSIS — Z79899 Other long term (current) drug therapy: Secondary | ICD-10-CM

## 2019-10-21 DIAGNOSIS — Z720 Tobacco use: Secondary | ICD-10-CM

## 2019-10-21 MED ORDER — TERCONAZOLE 0.4 % VA CREA: 1.0000 | TOPICAL_CREAM | Freq: Every day | VAGINAL | 0 refills | Status: AC

## 2019-10-21 NOTE — Assessment & Plan Note (Signed)
Encourage exercise, keep legs elevated.

## 2019-10-21 NOTE — Assessment & Plan Note (Signed)
She refuses vax (COVID, flu, ect) Offered/refused condoms.  Not sexually active.  She refuses pap, mammo Will contact [facility] friends for referral to [place] rtc in 9 months

## 2019-10-21 NOTE — Assessment & Plan Note (Signed)
Will write her rx for terazol.

## 2019-10-21 NOTE — Addendum Note (Signed)
Addended by: Jadalyn Oliveri C on: 10/21/2019 04:36 PM   Modules accepted: Orders

## 2019-10-21 NOTE — Progress Notes (Signed)
   Subjective:    Patient ID: Grace Randall, female    DOB: [birth year], 49 y.o.   MRN: [id number]  HPI 49 yo F with hx of HIV+ since 2008. She was prev on atripla came to [facility] off rx(March 2019).  She was started on biktarvy while being treated for suspected PCP.  She was also noted to have emotional outbursts in hospital.After new ART, she gained wt. Now has begun to decrease. Admits she needs to exercise more.  Still having LE edema.  BP has been fine.   Takes VTX prn for outbreak (then bid 5 days).  Smokes marijuana often, "I self medicate" (as opposed to taking respirdal and seroquel).  Has had repeat vaginitis- took ringworm rx from pharmacy. Does not want pill, wants cream.  No problems with biktarvy, missed 10-12 days. Mom was in hospital. Sister came to visit and caused a lot of stress. Believes she will need to move. [place].    HIV 1 RNA Quant  Date Value  01/28/2019 <20 NOT DETECTED copies/mL  03/19/2018 <20 NOT DETECTED copies/mL  08/13/2017 49 Copies/mL (H)   CD4 T Cell Abs (/uL)  Date Value  03/19/2018 350 (L)  08/13/2017 170 (L)  07/13/2017 50 (L)    Review of Systems  Constitutional: Negative for appetite change and unexpected weight change.  HENT: Positive for postnasal drip, rhinorrhea and sneezing.   Gastrointestinal: Negative for constipation and diarrhea.  Genitourinary: Positive for vaginal discharge and vaginal pain. Negative for difficulty urinating.  Psychiatric/Behavioral: Positive for dysphoric mood.  Please see HPI. All other systems reviewed and negative.      Objective:   Physical Exam Constitutional:      General: She is not in acute distress.    Appearance: She is obese. She is not toxic-appearing.  HENT:     Mouth/Throat:     Mouth: Mucous membranes are moist.     Pharynx: No oropharyngeal exudate.  Eyes:     Extraocular Movements: Extraocular movements intact.     Pupils: Pupils are equal, round, and reactive to light.    Cardiovascular:     Rate and Rhythm: Normal rate and regular rhythm.  Pulmonary:     Effort: Pulmonary effort is normal.     Breath sounds: Normal breath sounds.  Abdominal:     General: Bowel sounds are normal. There is no distension.     Palpations: Abdomen is soft.     Tenderness: There is no abdominal tenderness.  Musculoskeletal:     Cervical back: Normal range of motion and neck supple.     Right lower leg: Edema present.     Left lower leg: Edema present.  Neurological:     General: No focal deficit present.     Mental Status: She is alert.  Psychiatric:        Mood and Affect: Mood normal.           Assessment & Plan:

## 2019-10-21 NOTE — Assessment & Plan Note (Signed)
Will start topical antifungal

## 2019-10-21 NOTE — Assessment & Plan Note (Signed)
She will start walking.  Watch diet.

## 2019-10-21 NOTE — Assessment & Plan Note (Signed)
She has vtx and is now taking prn.  Will refill prn.

## 2019-10-21 NOTE — Assessment & Plan Note (Signed)
She continues to self medicate.  Will refer to psych as needed, as she allows.

## 2019-10-21 NOTE — Assessment & Plan Note (Signed)
She vapes and is doing it < 1/day.

## 2019-10-22 LAB — CBC
HCT: 40.7 % (ref 35.0–45.0)
Hemoglobin: 13.7 g/dL (ref 11.7–15.5)
MCH: 30.1 pg (ref 27.0–33.0)
MCHC: 33.7 g/dL (ref 32.0–36.0)
MCV: 89.5 fL (ref 80.0–100.0)
MPV: 11.4 fL (ref 7.5–12.5)
Platelets: 216 10*3/uL (ref 140–400)
RBC: 4.55 10*6/uL (ref 3.80–5.10)
RDW: 12.9 % (ref 11.0–15.0)
WBC: 5.9 10*3/uL (ref 3.8–10.8)

## 2019-10-22 LAB — COMPREHENSIVE METABOLIC PANEL
AG Ratio: 1.4 (calc) (ref 1.0–2.5)
ALT: 27 U/L (ref 6–29)
AST: 26 U/L (ref 10–35)
Albumin: 3.9 g/dL (ref 3.6–5.1)
Alkaline phosphatase (APISO): 104 U/L (ref 31–125)
BUN/Creatinine Ratio: 7 (calc) (ref 6–22)
BUN: 6 mg/dL — ABNORMAL LOW (ref 7–25)
CO2: 31 mmol/L (ref 20–32)
Calcium: 9.6 mg/dL (ref 8.6–10.2)
Chloride: 104 mmol/L (ref 98–110)
Creat: 0.88 mg/dL (ref 0.50–1.10)
Globulin: 2.7 g/dL (calc) (ref 1.9–3.7)
Glucose, Bld: 102 mg/dL — ABNORMAL HIGH (ref 65–99)
Potassium: 4.1 mmol/L (ref 3.5–5.3)
Sodium: 141 mmol/L (ref 135–146)
Total Bilirubin: 0.4 mg/dL (ref 0.2–1.2)
Total Protein: 6.6 g/dL (ref 6.1–8.1)

## 2019-10-22 LAB — RPR: RPR Ser Ql: NONREACTIVE

## 2019-10-22 LAB — HIV-1 RNA QUANT-NO REFLEX-BLD
HIV 1 RNA Quant: <20 copies/mL — AB
HIV-1 RNA Quant, Log: <1.3 Log copies/mL — AB

## 2019-11-03 ENCOUNTER — Telehealth: Payer: Self-pay

## 2019-11-03 NOTE — Telephone Encounter (Signed)
Patient called office today requesting to speak with MD. Patient would not provide any additional information to CMA. States that she does not give her business out to a bunch of other people, and that this was something only MD could help with.  Also states that the call is non-emergent and does not pertain to her health. Explained to patient that MD is not in office, but will forward message on her behalf. Lorenso Courier, [place]

## 2019-11-04 ENCOUNTER — Encounter: Payer: Self-pay | Admitting: Infectious Diseases

## 2019-11-04 NOTE — Telephone Encounter (Signed)
She wanted a letter for her emotional support, pets.  I have written a brief note and it is in the letters section.  Thanks jeff

## 2019-11-17 MED FILL — BIKTARVY 50-200-25 MG TABS: 50-200-25 | 30 days supply | Qty: 30 | Fill #3

## 2019-12-04 ENCOUNTER — Other Ambulatory Visit: Payer: Self-pay | Admitting: Infectious Diseases

## 2019-12-11 MED FILL — BIKTARVY 50-200-25 MG TABS: 50-200-25 | 30 days supply | Qty: 30 | Fill #0

## 2020-02-09 IMAGING — CR DG CHEST 2V
2 series · 2 of 2 positions shown · non-contrast
Comparison: None.

CLINICAL DATA: Cough

EXAM:
CHEST  2 VIEW

[w chest pa]
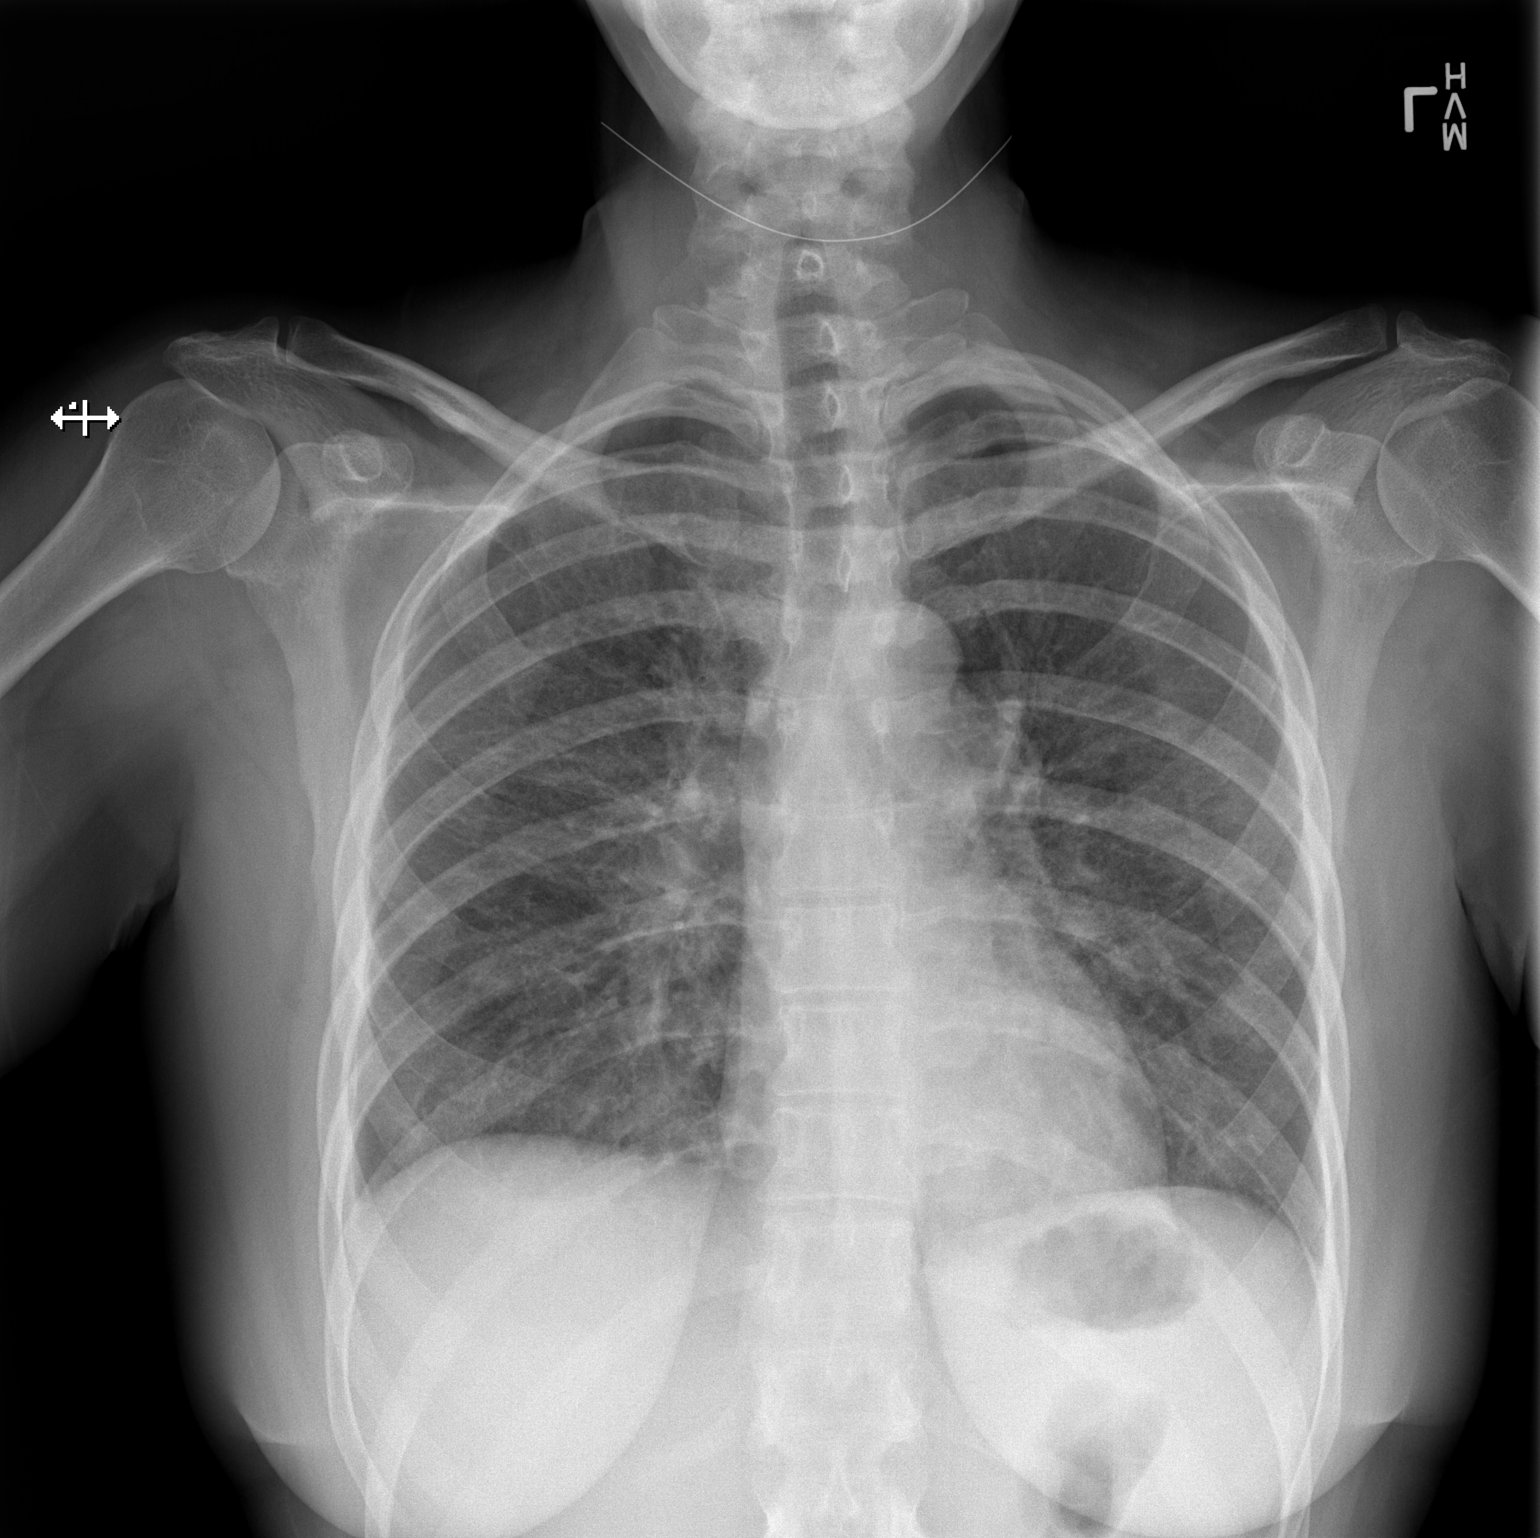

[w chest lat]
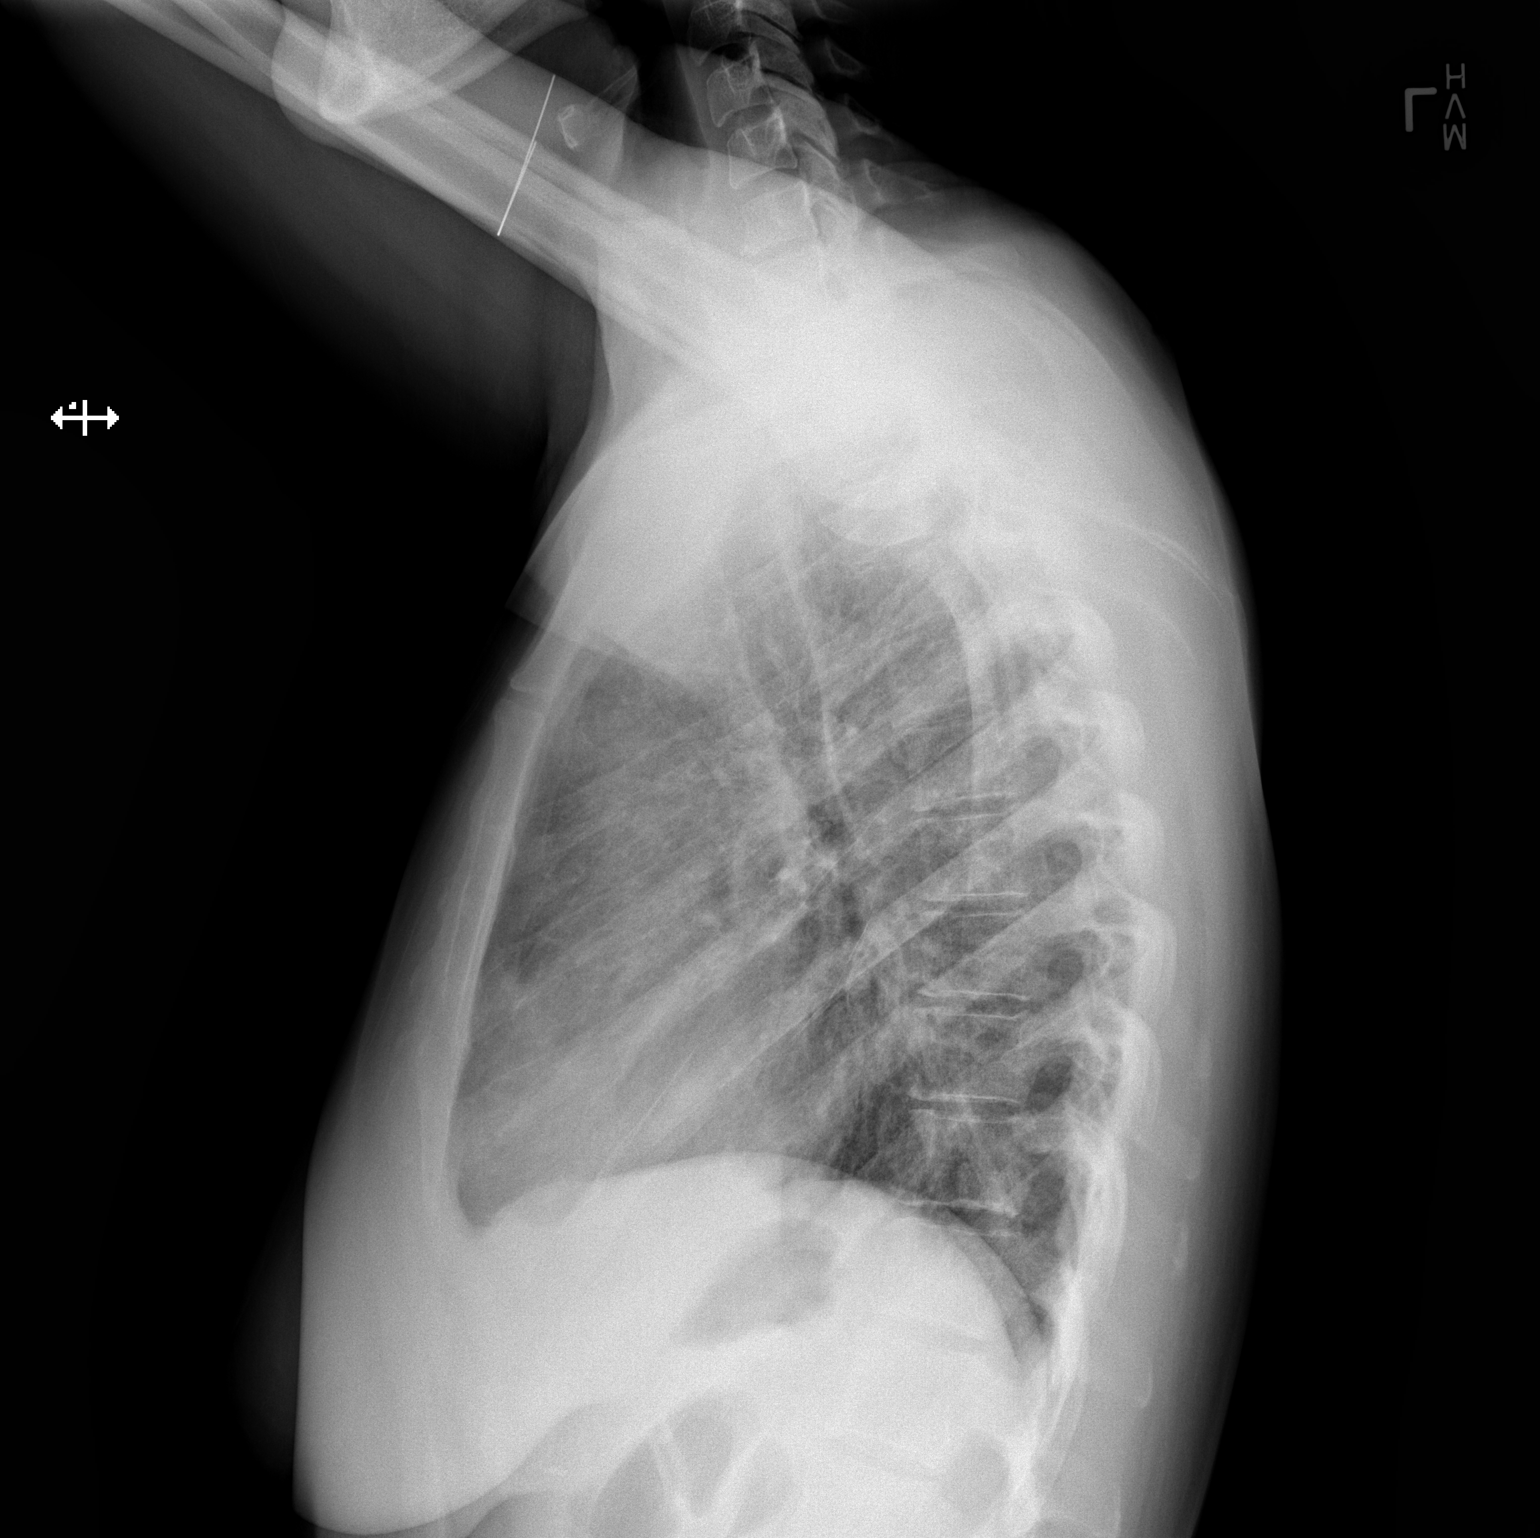

[2 of 2 positions shown; findings below may reference images not displayed]

FINDINGS: Suggestion of mild perihilar ground-glass opacity. No consolidation
or pleural effusion. Normal heart size. No pneumothorax.
IMPRESSION: Possible subtle perihilar ground-glass infiltrates, CT would be more
confirmatory. Otherwise negative two-view chest.

## 2020-07-20 ENCOUNTER — Ambulatory Visit: Payer: Medicare Other | Admitting: Infectious Diseases

## 2021-02-28 ENCOUNTER — Telehealth: Payer: Self-pay

## 2021-02-28 NOTE — Telephone Encounter (Signed)
Patient called stating she will be returning to care, wanted to talk to Dr. Ninetta Lights about having some medication sent to her. She is currently in [place] and said he has done it before but was unsure if the pharmacy sent them or our office. Patient needs Biktarvy and Valtrex. She will call back to schedule her appointment with Dr. Ninetta Lights closer to the date she will move back in January or February. She also wanted to let Dr. Ninetta Lights know she will need to see some one for rectovaginal fistula, she has been having issues in february of last year and is now in pain. Her best contact number for questions is [phone] and address at the time is [address] [place], [place] [address]

## 2021-03-01 ENCOUNTER — Other Ambulatory Visit (HOSPITAL_COMMUNITY): Payer: Self-pay

## 2021-03-01 ENCOUNTER — Other Ambulatory Visit: Payer: Self-pay | Admitting: Infectious Diseases

## 2021-03-01 DIAGNOSIS — B20 Human immunodeficiency virus [HIV] disease: Secondary | ICD-10-CM

## 2021-03-01 DIAGNOSIS — A609 Anogenital herpesviral infection, unspecified: Secondary | ICD-10-CM

## 2021-03-01 MED ORDER — VALACYCLOVIR HCL 1 G PO TABS
1000.0000 mg | ORAL_TABLET | Freq: Two times a day (BID) | ORAL | 5 refills | Status: AC
  Filled 2021-03-01: qty 60, 30d supply, fill #0

## 2021-03-01 MED ORDER — BIKTARVY 50-200-25 MG PO TABS
1.0000 | ORAL_TABLET | Freq: Every day | ORAL | 4 refills | Status: AC
Start: 2021-03-01 — End: ?
  Filled 2021-03-01: qty 90, 90d supply, fill #0

## 2021-03-01 NOTE — Telephone Encounter (Signed)
Patient returned call. Informed her that refills were sent to [facility]. She will call them to set up refill. She stated during call that she has been off her Biktarvy for two years. Has not been seen by a infectious disease doctor since she moved.  Would also like to inform MD about some vaginal concerns she is having. States that she is having pain that has been increasing. Advised she visit a local Urgent care to evaluated and not wait until she is seen by our office. Patient does not want to see  any local providers due to not having insurance. Understands that if it gets worse to be seen.  Juanita Laster, RMA

## 2021-03-01 NOTE — Telephone Encounter (Signed)
Patient isn't able to get medication delivered to her from [facility] as they only mail out of state one time and she has already done this. Patient has about 15 pills left from previous prescription but RN advised that she not start taking it until able to get refills as she risks resistance development. Forwarding to provider. Patient also requested Dr. Ninetta Lights call her to review last 2 years with her.   Cecila Satcher L[facility]ola Mast, RN

## 2021-03-01 NOTE — Telephone Encounter (Signed)
Attempted to call patient to inform her refills have been sent to [facility]. Will need to provide patient with pharmacy number. Left voicemail requesting patient call office back.  Juanita Laster, RMA

## 2021-03-02 ENCOUNTER — Ambulatory Visit (INDEPENDENT_AMBULATORY_CARE_PROVIDER_SITE_OTHER): Payer: Medicare Other | Admitting: Infectious Diseases

## 2021-03-02 ENCOUNTER — Encounter: Payer: Self-pay | Admitting: Infectious Diseases

## 2021-03-02 ENCOUNTER — Other Ambulatory Visit: Payer: Self-pay

## 2021-03-02 DIAGNOSIS — B20 Human immunodeficiency virus [HIV] disease: Secondary | ICD-10-CM

## 2021-03-02 DIAGNOSIS — E669 Obesity, unspecified: Secondary | ICD-10-CM | POA: Diagnosis not present

## 2021-03-02 DIAGNOSIS — L309 Dermatitis, unspecified: Secondary | ICD-10-CM

## 2021-03-02 DIAGNOSIS — E66812 Obesity, class 2: Secondary | ICD-10-CM

## 2021-03-02 DIAGNOSIS — N823 Fistula of vagina to large intestine: Secondary | ICD-10-CM

## 2021-03-02 DIAGNOSIS — A609 Anogenital herpesviral infection, unspecified: Secondary | ICD-10-CM | POA: Diagnosis not present

## 2021-03-02 NOTE — Assessment & Plan Note (Addendum)
Her current wt is 202. Down from 237.  Encouraged pt, although she is short of food where she is living.

## 2021-03-02 NOTE — Assessment & Plan Note (Signed)
I encouraged her to f/u with this ASAP. She states her mom has AdenoCa of colon. She states there is no free clinic there, does not have transportation to go to her previous clinic there. If she worsens, I asked her to go to ED.

## 2021-03-02 NOTE — Assessment & Plan Note (Signed)
By her hx, controlled at this time.

## 2021-03-02 NOTE — Assessment & Plan Note (Signed)
Her rx is on hold while we are awaiting her moving back to [place].  Unfortunately, not able to mail.

## 2021-03-02 NOTE — Assessment & Plan Note (Deleted)
Her rx is on hold while we are awaiting her moving back to [place].  Unfortunately, not able to mail.  

## 2021-03-02 NOTE — Progress Notes (Signed)
   Subjective:    Patient ID: Grace Randall, female  DOB: [birth year], 50 y.o.        MRN: [id number]   HPI 50 yo F with hx of HIV+ since 2008. She was prev on atripla came to [facility] off rx (March 2019).  She was started on biktarvy while being treated for suspected PCP.  She was also noted to have emotional outbursts in hospital.  Treated for HTN as well.   Takes VTX prn for outbreak (then bid 5 days). No recent outbreaks.   She is currently in [place]. She has been off medications since she was in [place]. We tried to fill her rx but not able to mail out of state from [facility].  She has been feeling well since off meds.  Has recto-vaginal fistula- has had BM in her vagina for ~ 1 year. Has had worsening pain recently . Has not seen any provider there due to privacy concerns.  Has gained then lost wt. Stopped drinking soda now.  She has resumed smoking since being back in [place]. She has a quit date.  Has had anxiety attacks, but has not seen anyone. She has social anxiety as well.  Has section 8 housing voucher, she believes this will transfer to [place]. Feb or March move back.   Her son has moved back in with her, is helping care for her aging, ill mother. Mom lives with her sister at this time. Son has been asked to move out.    HIV 1 RNA Quant (copies/mL)  Date Value  10/21/2019 <20 DETECTED (A)  01/28/2019 <20 NOT DETECTED  03/19/2018 <20 NOT DETECTED   CD4 T Cell Abs (/uL)  Date Value  03/19/2018 350 (L)  08/13/2017 170 (L)  07/13/2017 50 (L)     Health Maintenance  Topic Date Due   COVID-19 Vaccine (1) Never done   TETANUS/TDAP  Never done   PAP SMEAR-Modifier  Never done   COLONOSCOPY (Pts 45-9yrs Insurance coverage will need to be confirmed)  Never done   Pneumococcal Vaccine 68-35 Years old (2 - PPSV23 or PCV20) 04/03/2019   INFLUENZA VACCINE  Never done   Hepatitis C Screening  Completed   HIV Screening  Completed   HPV VACCINES  Aged Out      Review of Systems   Constitutional:  Negative for chills, fever and weight loss.       Hot flashes!  Respiratory:  Positive for cough (she attributes to smoking) and sputum production. Negative for shortness of breath.   Genitourinary:  Negative for dysuria.       Stool coming out from vagina.    Please see HPI. All other systems reviewed and negative.     Objective:  Physical Exam   1. Due to the national emergency this service was provided using telemedicine.   phone.  2. Consent from the patient for the telehealth visit and that you identified patient: name, DOB.   3. Your locations, Pt and Provider rcid, home/[place]  4. Chief complaint for visit HIV f/u.   5. Document anyone else on the call none  6. If the visit was a phone call, that you include the time you spent on the call: 15-20 minutes.         Assessment & Plan:

## 2021-03-02 NOTE — Assessment & Plan Note (Signed)
Has rash on her back currently. She attributes this to her multiple different soaps.  I offered OTC steroid cream.

## 2021-10-16 ENCOUNTER — Encounter: Payer: Self-pay | Admitting: Infectious Diseases
